# Patient Record
Sex: Female | Born: 1960
Health system: Southern US, Community
[De-identification: ages and names within clinical notes are randomized; demographics above are authoritative.]

## PROBLEM LIST (undated history)

## (undated) DIAGNOSIS — D249 Benign neoplasm of unspecified breast: Secondary | ICD-10-CM

## (undated) DIAGNOSIS — Z973 Presence of spectacles and contact lenses: Secondary | ICD-10-CM

## (undated) DIAGNOSIS — IMO0002 Reserved for concepts with insufficient information to code with codable children: Secondary | ICD-10-CM

## (undated) DIAGNOSIS — E079 Disorder of thyroid, unspecified: Secondary | ICD-10-CM

## (undated) DIAGNOSIS — G479 Sleep disorder, unspecified: Secondary | ICD-10-CM

## (undated) DIAGNOSIS — K8020 Calculus of gallbladder without cholecystitis without obstruction: Secondary | ICD-10-CM

## (undated) DIAGNOSIS — R51 Headache: Secondary | ICD-10-CM

## (undated) DIAGNOSIS — N6452 Nipple discharge: Secondary | ICD-10-CM

## (undated) DIAGNOSIS — M5020 Other cervical disc displacement, unspecified cervical region: Secondary | ICD-10-CM

## (undated) DIAGNOSIS — R519 Headache, unspecified: Secondary | ICD-10-CM

## (undated) HISTORY — DX: Headache: R51

## (undated) HISTORY — DX: Headache, unspecified: R51.9

## (undated) HISTORY — DX: Disorder of thyroid, unspecified: E07.9

## (undated) HISTORY — PX: APPENDECTOMY: SHX54

## (undated) HISTORY — DX: Other cervical disc displacement, unspecified cervical region: M50.20

## (undated) HISTORY — PX: ABDOMINAL HYSTERECTOMY: SHX81

## (undated) HISTORY — DX: Sleep disorder, unspecified: G47.9

## (undated) HISTORY — DX: Presence of spectacles and contact lenses: Z97.3

## (undated) HISTORY — DX: Reserved for concepts with insufficient information to code with codable children: IMO0002

## (undated) HISTORY — PX: CHOLECYSTECTOMY: SHX55

## (undated) HISTORY — DX: Calculus of gallbladder without cholecystitis without obstruction: K80.20

## (undated) HISTORY — DX: Nipple discharge: N64.52

## (undated) HISTORY — DX: Benign neoplasm of unspecified breast: D24.9

---

## 1997-09-02 ENCOUNTER — Other Ambulatory Visit: Admission: RE | Admit: 1997-09-02 | Discharge: 1997-09-02 | Payer: Self-pay | Admitting: *Deleted

## 1998-09-06 ENCOUNTER — Other Ambulatory Visit: Admission: RE | Admit: 1998-09-06 | Discharge: 1998-09-06 | Payer: Self-pay | Admitting: *Deleted

## 1998-09-30 ENCOUNTER — Ambulatory Visit (HOSPITAL_COMMUNITY): Admission: RE | Admit: 1998-09-30 | Discharge: 1998-09-30 | Payer: Self-pay | Admitting: *Deleted

## 1998-12-29 ENCOUNTER — Encounter (INDEPENDENT_AMBULATORY_CARE_PROVIDER_SITE_OTHER): Payer: Self-pay | Admitting: Specialist

## 1998-12-29 ENCOUNTER — Inpatient Hospital Stay (HOSPITAL_COMMUNITY): Admission: RE | Admit: 1998-12-29 | Discharge: 1999-01-01 | Payer: Self-pay | Admitting: *Deleted

## 1999-10-24 ENCOUNTER — Other Ambulatory Visit: Admission: RE | Admit: 1999-10-24 | Discharge: 1999-10-24 | Payer: Self-pay | Admitting: *Deleted

## 2001-02-21 ENCOUNTER — Other Ambulatory Visit: Admission: RE | Admit: 2001-02-21 | Discharge: 2001-02-21 | Payer: Self-pay | Admitting: *Deleted

## 2001-02-26 ENCOUNTER — Ambulatory Visit (HOSPITAL_COMMUNITY): Admission: RE | Admit: 2001-02-26 | Discharge: 2001-02-26 | Payer: Self-pay | Admitting: *Deleted

## 2001-02-26 ENCOUNTER — Encounter: Payer: Self-pay | Admitting: *Deleted

## 2002-02-23 ENCOUNTER — Other Ambulatory Visit: Admission: RE | Admit: 2002-02-23 | Discharge: 2002-02-23 | Payer: Self-pay | Admitting: *Deleted

## 2003-03-01 ENCOUNTER — Other Ambulatory Visit: Admission: RE | Admit: 2003-03-01 | Discharge: 2003-03-01 | Payer: Self-pay | Admitting: *Deleted

## 2004-03-10 ENCOUNTER — Other Ambulatory Visit: Admission: RE | Admit: 2004-03-10 | Discharge: 2004-03-10 | Payer: Self-pay | Admitting: Obstetrics and Gynecology

## 2005-04-16 ENCOUNTER — Other Ambulatory Visit: Admission: RE | Admit: 2005-04-16 | Discharge: 2005-04-16 | Payer: Self-pay | Admitting: Obstetrics and Gynecology

## 2006-11-08 ENCOUNTER — Encounter: Admission: RE | Admit: 2006-11-08 | Discharge: 2006-11-08 | Payer: Self-pay | Admitting: Obstetrics and Gynecology

## 2006-11-21 ENCOUNTER — Encounter: Admission: RE | Admit: 2006-11-21 | Discharge: 2006-11-21 | Payer: Self-pay | Admitting: Obstetrics and Gynecology

## 2006-11-21 ENCOUNTER — Encounter (INDEPENDENT_AMBULATORY_CARE_PROVIDER_SITE_OTHER): Payer: Self-pay | Admitting: Diagnostic Radiology

## 2007-11-05 ENCOUNTER — Encounter: Admission: RE | Admit: 2007-11-05 | Discharge: 2007-11-05 | Payer: Self-pay | Admitting: Obstetrics and Gynecology

## 2007-11-13 ENCOUNTER — Encounter: Admission: RE | Admit: 2007-11-13 | Discharge: 2007-11-13 | Payer: Self-pay | Admitting: Obstetrics and Gynecology

## 2008-04-28 ENCOUNTER — Encounter: Admission: RE | Admit: 2008-04-28 | Discharge: 2008-04-28 | Payer: Self-pay | Admitting: Internal Medicine

## 2008-12-02 ENCOUNTER — Encounter: Admission: RE | Admit: 2008-12-02 | Discharge: 2008-12-02 | Payer: Self-pay | Admitting: Internal Medicine

## 2009-02-08 ENCOUNTER — Encounter: Admission: RE | Admit: 2009-02-08 | Discharge: 2009-02-08 | Payer: Self-pay | Admitting: Gastroenterology

## 2009-02-16 ENCOUNTER — Encounter: Admission: RE | Admit: 2009-02-16 | Discharge: 2009-02-16 | Payer: Self-pay | Admitting: Gastroenterology

## 2009-08-05 ENCOUNTER — Encounter: Admission: RE | Admit: 2009-08-05 | Discharge: 2009-08-05 | Payer: Self-pay | Admitting: General Surgery

## 2009-12-06 ENCOUNTER — Encounter: Admission: RE | Admit: 2009-12-06 | Discharge: 2009-12-06 | Payer: Self-pay | Admitting: Obstetrics and Gynecology

## 2010-06-04 DIAGNOSIS — K8020 Calculus of gallbladder without cholecystitis without obstruction: Secondary | ICD-10-CM

## 2010-06-04 HISTORY — PX: BREAST EXCISIONAL BIOPSY: SUR124

## 2010-06-04 HISTORY — DX: Calculus of gallbladder without cholecystitis without obstruction: K80.20

## 2010-06-24 ENCOUNTER — Other Ambulatory Visit: Payer: Self-pay | Admitting: Gastroenterology

## 2010-06-24 DIAGNOSIS — K824 Cholesterolosis of gallbladder: Secondary | ICD-10-CM

## 2010-06-25 ENCOUNTER — Encounter: Payer: Self-pay | Admitting: Obstetrics and Gynecology

## 2010-08-03 ENCOUNTER — Other Ambulatory Visit: Payer: Self-pay

## 2010-08-07 ENCOUNTER — Ambulatory Visit
Admission: RE | Admit: 2010-08-07 | Discharge: 2010-08-07 | Disposition: A | Discharge status: Home / Self Care | Payer: BC Managed Care – PPO | Source: Ambulatory Visit | Attending: Gastroenterology | Admitting: Gastroenterology

## 2010-08-07 DIAGNOSIS — K824 Cholesterolosis of gallbladder: Secondary | ICD-10-CM

## 2010-09-04 ENCOUNTER — Encounter (HOSPITAL_COMMUNITY)
Admission: RE | Admit: 2010-09-04 | Discharge: 2010-09-04 | Disposition: A | Discharge status: Home / Self Care | Payer: BC Managed Care – PPO | Source: Ambulatory Visit | Attending: General Surgery | Admitting: General Surgery

## 2010-09-04 DIAGNOSIS — Z01812 Encounter for preprocedural laboratory examination: Secondary | ICD-10-CM | POA: Insufficient documentation

## 2010-09-04 LAB — DIFFERENTIAL
Basophils Absolute: 0 10*3/uL (ref 0.0–0.1)
Basophils Relative: 1 % (ref 0–1)
Eosinophils Relative: 2 % (ref 0–5)
Monocytes Absolute: 0.5 10*3/uL (ref 0.1–1.0)
Neutro Abs: 2.3 10*3/uL (ref 1.7–7.7)

## 2010-09-04 LAB — CBC
MCHC: 33.4 g/dL (ref 30.0–36.0)
RDW: 12.5 % (ref 11.5–15.5)
WBC: 5.4 10*3/uL (ref 4.0–10.5)

## 2010-09-04 LAB — COMPREHENSIVE METABOLIC PANEL
ALT: 18 U/L (ref 0–35)
AST: 22 U/L (ref 0–37)
Albumin: 4.2 g/dL (ref 3.5–5.2)
Calcium: 9.4 mg/dL (ref 8.4–10.5)
GFR calc Af Amer: >60 mL/min (ref >60)
Potassium: 4 mEq/L (ref 3.5–5.1)
Sodium: 139 mEq/L (ref 135–145)
Total Protein: 7.3 g/dL (ref 6.0–8.3)

## 2010-09-12 ENCOUNTER — Other Ambulatory Visit: Payer: Self-pay | Admitting: General Surgery

## 2010-09-12 ENCOUNTER — Ambulatory Visit (HOSPITAL_COMMUNITY): Payer: BC Managed Care – PPO

## 2010-09-12 ENCOUNTER — Ambulatory Visit (HOSPITAL_COMMUNITY)
Admission: RE | Admit: 2010-09-12 | Discharge: 2010-09-12 | Disposition: A | Discharge status: Home / Self Care | Payer: BC Managed Care – PPO | Source: Ambulatory Visit | Attending: General Surgery | Admitting: General Surgery

## 2010-09-12 DIAGNOSIS — K824 Cholesterolosis of gallbladder: Secondary | ICD-10-CM | POA: Insufficient documentation

## 2010-09-12 DIAGNOSIS — Z01812 Encounter for preprocedural laboratory examination: Secondary | ICD-10-CM | POA: Insufficient documentation

## 2010-09-12 DIAGNOSIS — K8010 Calculus of gallbladder with chronic cholecystitis without obstruction: Secondary | ICD-10-CM | POA: Insufficient documentation

## 2010-09-13 NOTE — Op Note (Signed)
NAMELAYSA, KIMMEY                ACCOUNT NO.:  [id number]  MEDICAL RECORD NO.:  [id number]           PATIENT TYPE:  O  LOCATION:  [facility]SC                         FACILITY:  [facility]  PHYSICIAN:  Adolph Pollack, M.D.DATE OF BIRTH:  Feb 15, 1961  DATE OF PROCEDURE:  09/12/2010 DATE OF DISCHARGE:  09/12/2010                              OPERATIVE REPORT   POSTOPERATIVE DIAGNOSIS:  Gallbladder polyps.  POSTOPERATIVE DIAGNOSIS:  Gallbladder polyps.  PROCEDURE:  Laparoscopic cholecystectomy with cholangiogram.  SURGEON:  Adolph Pollack, MD  ASSISTANT:  Anselm Pancoast. Zachery Dakins, MD  ANESTHESIA:  General.  INDICATIONS:  Ms. Iversen is a 50 year old female who has been discovered to have gallbladder polyps.  A followup ultrasound demonstrated enlargement of the polyps, largest one was almost 1 cm.  Because of the potential association with gallbladder cancer and polyps 1 cm greater, I have recommended a laparoscopic cholecystectomy and she agrees. Procedure risks were discussed preoperatively.  TECHNIQUE:  She was brought to the operating room, placed supine on the operating room table.  General anesthetic was administered.  The abdominal wall was sterilely prepped and draped.  Marcaine solution was infiltrated in the subumbilical region and the subcutaneous tissue.  A small subumbilical incision was made through the skin and subcutaneous tissue, fascia and peritoneum entering the peritoneal cavity under direct vision.  A purse-string suture of 0 Vicryl was placed on the fascial edges.  A Hasson trocar was placed into the peritoneal cavity, and a pneumoperitoneum created by insufflation of CO2 gas.  Next a laparoscope was introduced.  There was no underlying bleeding or organ injury.  I noted some thin adhesions between the cecum and right colon and lateral sidewall.  The gallbladder was without any acute inflammatory changes.  She was placed in a reverse Trendelenburg position,  the right side tilted slightly up.  A 5 mm incision was made.  A 5-mm trocar was placed in the epigastric incision and two 5-mm trocar was placed in the right upper quadrant.  The fundus of the gallbladder was grasped and thin adhesions between the omentum and gallbladder taken down bluntly.  Using blunt dissection, I mobilized the infundibulum, identified the cystic duct and anterior branch of the cystic artery.  I created a window around the cystic duct and the anterior branch of the cystic artery.  I clipped the anterior branch of the cystic artery and divided it.  A critical view was achieved.  Clip was placed at the cystic duct gallbladder junction.  A small incision was made in the cystic duct.  A cholangiocath was passed through the anterior abdominal wall, placed into the cystic duct and a cholangiogram was performed.  Under real time fluoroscopy, dilute contrast was injected to the cystic duct.  Common hepatic, right and left hepatic, and common bile ducts all filled promptly and contrast drained into the duodenum without obvious evidence of obstruction.  Final reports pending the radiologist's interpretation.  The cholangiocath was removed, cystic duct was clipped 2 times on the biliary side and divided.  Posterior branch of the cystic artery was identified close to the gallbladder, was clipped and divided.  The gallbladder was then dissected free from liver intact using electrocautery and placed into a pouch bag.  The gallbladder fossa was copiously irrigated and inspected.  There was no evidence of bleeding or bile leak.  Following this the gallbladder was removed through the subumbilical incision and the subumbilical fascial defect closed by tightening up and tying down the purse-string suture.  CO2 gas was released and the remaining trocars were removed.  The skin incisions were closed with 4-0 Monocryl subcuticular stitches. Steri-Strips and sterile dressings were  applied.  She tolerated the procedure well without any apparent complications.  She was taken to the recovery room in satisfactory condition.     Adolph Pollack, M.D.     Kari Baars  D:  09/12/2010  T:  09/13/2010  Job:  [id number]  cc:   Anselmo Rod, MD, Josetta Huddle, M.D.  Electronically Signed by Avel Peace M.D. on 09/13/2010 04:59:29 PM

## 2010-12-11 ENCOUNTER — Other Ambulatory Visit: Payer: Self-pay | Admitting: Obstetrics and Gynecology

## 2010-12-11 DIAGNOSIS — Z1231 Encounter for screening mammogram for malignant neoplasm of breast: Secondary | ICD-10-CM

## 2010-12-18 ENCOUNTER — Ambulatory Visit: Payer: BC Managed Care – PPO

## 2010-12-18 ENCOUNTER — Ambulatory Visit
Admission: RE | Admit: 2010-12-18 | Discharge: 2010-12-18 | Disposition: A | Discharge status: Home / Self Care | Payer: BC Managed Care – PPO | Source: Ambulatory Visit | Attending: Obstetrics and Gynecology | Admitting: Obstetrics and Gynecology

## 2010-12-18 DIAGNOSIS — Z1231 Encounter for screening mammogram for malignant neoplasm of breast: Secondary | ICD-10-CM

## 2011-01-01 ENCOUNTER — Other Ambulatory Visit: Payer: Self-pay | Admitting: Obstetrics and Gynecology

## 2011-01-01 DIAGNOSIS — N6452 Nipple discharge: Secondary | ICD-10-CM

## 2011-01-05 ENCOUNTER — Inpatient Hospital Stay: Admission: RE | Admit: 2011-01-05 | Payer: BC Managed Care – PPO | Source: Ambulatory Visit

## 2011-01-22 ENCOUNTER — Ambulatory Visit
Admission: RE | Admit: 2011-01-22 | Discharge: 2011-01-22 | Disposition: A | Discharge status: Home / Self Care | Payer: BC Managed Care – PPO | Source: Ambulatory Visit | Attending: Obstetrics and Gynecology | Admitting: Obstetrics and Gynecology

## 2011-01-22 DIAGNOSIS — N6452 Nipple discharge: Secondary | ICD-10-CM

## 2011-01-23 ENCOUNTER — Ambulatory Visit (INDEPENDENT_AMBULATORY_CARE_PROVIDER_SITE_OTHER): Payer: BC Managed Care – PPO | Admitting: General Surgery

## 2011-01-23 ENCOUNTER — Encounter (INDEPENDENT_AMBULATORY_CARE_PROVIDER_SITE_OTHER): Payer: Self-pay | Admitting: General Surgery

## 2011-01-23 VITALS — BP 108/76 | HR 62 | Temp 97.4°F | Ht 64.0 in | Wt 127.1 lb

## 2011-01-23 DIAGNOSIS — N6459 Other signs and symptoms in breast: Secondary | ICD-10-CM

## 2011-01-23 DIAGNOSIS — N6452 Nipple discharge: Secondary | ICD-10-CM

## 2011-01-23 NOTE — Progress Notes (Signed)
Chief Complaint  Patient presents with  . Other    ne wpt - eval of abnormal nipple discharge on right breast    HPI Kara Sweeney is a 50 y.o. female.   HPIShe is referred by Dr. Deboraha Sprang because of bloody right nipple discharge.  She had a mammogram and one week after that noted blood in her bra coming from her right nipple.  Other than mammogram, no trauma to the area. No palpable masses. Had 2 episodes of brief pain. No family history of breast cancer. Menarche was early age 43. First pregnancy in her 57s. She breast-fed her children. Had a hysterectomy at approximately age 69. Has been on hormone replacement therapy ever since.  No lesions noted on mammogram. Ductogram demonstrated an area of irregularity 11 mm below the nipple.  Past Medical History  Diagnosis Date  . Thyroid disease     hypothyroidism   . Gallstone 2012  . Cyst     on right hip and bottom of right foot   . Fibroadenoma     right breast   . Nipple discharge   . Wears glasses     contacts  . Herniated disc, cervical     Past Surgical History  Procedure Date  . Abdominal hysterectomy   . Cholecystectomy     Family History  Problem Relation Age of Onset  . Cancer Father     bladder  . Cancer Sister     liver     Social History History  Substance Use Topics  . Smoking status: Never Smoker   . Smokeless tobacco: Not on file  . Alcohol Use: No    Allergies  Allergen Reactions  . Sulfa Drugs Cross Reactors     Makes pt itchy and get hives     Current Outpatient Prescriptions  Medication Sig Dispense Refill  . Cholecalciferol (VITAMIN D PO) Take 1,000 Units by mouth.        . ESTRADIOL PO Take 2 mg by mouth daily.        . Levothyroxine Sodium (L-THYROXINE SODIUM) POWD 125 mcg by Does not apply route.        . Zolpidem Tartrate (AMBIEN PO) Take by mouth as needed.          Review of Systems Review of Systems  Constitutional: Negative.   HENT:       Wears glasses.  Respiratory: Negative.     Cardiovascular: Negative.   Gastrointestinal: Negative.   Skin: Negative.     Blood pressure 108/76, pulse 62, temperature 97.4 F (36.3 C), height 5\' 4"  (1.626 m), weight 127 lb 2 oz (57.664 kg).  Physical Exam Physical Exam  Constitutional: She appears well-developed and well-nourished. No distress.  Neck: Normal range of motion. Neck supple. No JVD present.  Respiratory:       Right breast-no dominant masses; small amount of bloody nipple discharge at 9:00 position; no suspicious rashes.  Left breast-no dominant masses, nipple discharge, suspicious skin rashes.  Nodes-no palpable cervical, supraclavicular, axillary adenopathy.  GI: Soft.       Small upper abdominal scars.  Musculoskeletal:       1 cm soft tissue mass right plantar surface.  Lymphadenopathy:    She has no cervical adenopathy.  Skin: Skin is warm.     Data Reviewed Mammogram and ductogram.  Assessment    Bloody right nipple discharge-most likely etiology is a benign papilloma; breast cancer is also in the differential however.    Plan  Right breast duct excision and biopsy. The procedure and risks were explained to her. Risks include but are not limited to bleeding, infection, wound problems, anesthesia, possibility of second surgery. She seemed to understand this and agreed with the plan.       Hanne Kegg J 01/23/2011, 2:38 PM

## 2011-02-02 ENCOUNTER — Other Ambulatory Visit (HOSPITAL_COMMUNITY)
Admission: RE | Admit: 2011-02-02 | Discharge: 2011-02-02 | Disposition: A | Discharge status: Home / Self Care | Payer: BC Managed Care – PPO | Source: Ambulatory Visit | Attending: General Surgery | Admitting: General Surgery

## 2011-02-02 ENCOUNTER — Other Ambulatory Visit (INDEPENDENT_AMBULATORY_CARE_PROVIDER_SITE_OTHER): Payer: Self-pay | Admitting: General Surgery

## 2011-02-02 DIAGNOSIS — N6459 Other signs and symptoms in breast: Secondary | ICD-10-CM | POA: Insufficient documentation

## 2011-02-02 DIAGNOSIS — D249 Benign neoplasm of unspecified breast: Secondary | ICD-10-CM

## 2011-02-09 ENCOUNTER — Telehealth (INDEPENDENT_AMBULATORY_CARE_PROVIDER_SITE_OTHER): Payer: Self-pay

## 2011-02-09 NOTE — Telephone Encounter (Signed)
Path reviewed with patient- She is aware that Dr. Abbey Chatters will go over the report in detail on post op visit. RMP

## 2011-02-19 ENCOUNTER — Ambulatory Visit (INDEPENDENT_AMBULATORY_CARE_PROVIDER_SITE_OTHER): Payer: BC Managed Care – PPO | Admitting: General Surgery

## 2011-02-23 ENCOUNTER — Other Ambulatory Visit: Payer: Self-pay | Admitting: Gastroenterology

## 2011-02-23 DIAGNOSIS — R7989 Other specified abnormal findings of blood chemistry: Secondary | ICD-10-CM

## 2011-02-26 ENCOUNTER — Ambulatory Visit (INDEPENDENT_AMBULATORY_CARE_PROVIDER_SITE_OTHER): Payer: BC Managed Care – PPO | Admitting: General Surgery

## 2011-02-26 ENCOUNTER — Other Ambulatory Visit: Payer: BC Managed Care – PPO

## 2011-02-26 VITALS — BP 102/62 | HR 84 | Resp 22

## 2011-02-26 DIAGNOSIS — D249 Benign neoplasm of unspecified breast: Secondary | ICD-10-CM

## 2011-02-26 NOTE — Progress Notes (Signed)
Operation: Right breast duct excision and biopsy  Date: February 02, 2011  Pathology: Intraductal papilloma, benign fibrocystic change, PASH  HPI: She is here for her first postoperative visit.  She is doing well and has no breast complaints.   Physical Exam:Gen.-she looks well and is in no acute distress.  Right breast-the circumareolar incision is clean and intact with Steri-Strips present.   Assessment:  Bloody nipple discharge secondary to intraductal papilloma. Wound is healing well.  Plan: I explained to her she needs an annual breast exam and annual mammogram. Return visit p.r.n.

## 2011-03-01 ENCOUNTER — Ambulatory Visit
Admission: RE | Admit: 2011-03-01 | Discharge: 2011-03-01 | Disposition: A | Discharge status: Home / Self Care | Payer: BC Managed Care – PPO | Source: Ambulatory Visit | Attending: Gastroenterology | Admitting: Gastroenterology

## 2011-03-01 DIAGNOSIS — R7989 Other specified abnormal findings of blood chemistry: Secondary | ICD-10-CM

## 2011-03-01 MED ORDER — GADOBENATE DIMEGLUMINE 529 MG/ML IV SOLN
12.0000 mL | Freq: Once | INTRAVENOUS | Status: AC | PRN
  Administered 2011-03-01: 12 mL via INTRAVENOUS

## 2011-03-15 ENCOUNTER — Encounter: Payer: Self-pay | Admitting: *Deleted

## 2011-03-15 ENCOUNTER — Encounter: Payer: BC Managed Care – PPO | Attending: Endocrinology | Admitting: *Deleted

## 2011-03-15 DIAGNOSIS — Z713 Dietary counseling and surveillance: Secondary | ICD-10-CM | POA: Insufficient documentation

## 2011-03-15 DIAGNOSIS — E119 Type 2 diabetes mellitus without complications: Secondary | ICD-10-CM | POA: Insufficient documentation

## 2011-03-15 NOTE — Patient Instructions (Addendum)
Goals:  Follow Diabetes Meal Plan as instructed (yellow card).  Eat 3 meals and 1-3 snacks daily; Avoid meal skipping and have snack before workouts.  Add lean protein foods to all meals/snacks.  Continue physical activity regimen of 5 days/week for 60 mins.  Limit sweets and sugar sweetened beverages.

## 2011-03-15 NOTE — Progress Notes (Signed)
Medical Nutrition Therapy:  Appt start time: 1100 end time:  1200.  Assessment:  Primary concerns today: T2DM, New onset.  Here for nutritional counseling for T2DM. Reports her A1c is 6.3% and was very knowledgeable of DM.  Lost sister to liver cancer and now her lipases are elevated. In last month, pt has started exercising 5 days/week and watching breads, etc. Overall dietary intake adequate, though excessive CHO intake noted at hs snack (20 oz skim milk w/ a few cookies). Not instructed to check BGs; next A1c scheduled for January '13.   MEDICATIONS: See medication list; reviewed with pt.   DIETARY INTAKE:  Usual eating pattern includes 2 meals and 0-1 snacks per day.  Recent dinner of baked fish, asian salad w/ ginger dressing, black eyed peas, broccoli, and water; snack of 20 oz milk + 1-2 cookies.  Usual physical activity: For last month - 5 d/wk: walk for 1 hr OR bike 45 min  Estimated energy needs: 1400 calories 155-160 g carbohydrates  Progress Towards Goal(s):  NEW   Nutritional Diagnosis:  [place]-2.2 Altered nutrition-related laboratory (HgA1c) related to new onset T2DM as evidenced by patient reported A1c of 6.3% and excessive CHO at snacks.    Intervention/Goals:  Follow Diabetes Meal Plan as instructed (yellow card).  Eat 3 meals and 1-3 snacks daily; Avoid meal skipping and have snack before workouts.  Add lean protein foods to all meals/snacks.  Continue physical activity regimen of 5 days/week for 60 mins.  Limit sweets and sugar sweetened beverages.  Monitoring/Evaluation:  Dietary intake, exercise, A1c (as available), and body weight prn.

## 2012-01-30 ENCOUNTER — Other Ambulatory Visit: Payer: Self-pay | Admitting: Internal Medicine

## 2012-01-30 DIAGNOSIS — Z1231 Encounter for screening mammogram for malignant neoplasm of breast: Secondary | ICD-10-CM

## 2012-02-14 ENCOUNTER — Ambulatory Visit
Admission: RE | Admit: 2012-02-14 | Discharge: 2012-02-14 | Disposition: A | Discharge status: Home / Self Care | Payer: BC Managed Care – PPO | Source: Ambulatory Visit | Attending: Internal Medicine | Admitting: Internal Medicine

## 2012-02-14 DIAGNOSIS — Z1231 Encounter for screening mammogram for malignant neoplasm of breast: Secondary | ICD-10-CM

## 2013-06-18 ENCOUNTER — Other Ambulatory Visit: Payer: Self-pay

## 2013-06-18 DIAGNOSIS — Z1231 Encounter for screening mammogram for malignant neoplasm of breast: Secondary | ICD-10-CM

## 2013-07-09 ENCOUNTER — Ambulatory Visit
Admission: RE | Admit: 2013-07-09 | Discharge: 2013-07-09 | Disposition: A | Discharge status: Home / Self Care | Payer: BC Managed Care – PPO | Source: Ambulatory Visit

## 2013-07-09 DIAGNOSIS — Z1231 Encounter for screening mammogram for malignant neoplasm of breast: Secondary | ICD-10-CM

## 2013-07-15 ENCOUNTER — Other Ambulatory Visit: Payer: Self-pay | Admitting: Obstetrics and Gynecology

## 2013-07-15 DIAGNOSIS — R928 Other abnormal and inconclusive findings on diagnostic imaging of breast: Secondary | ICD-10-CM

## 2013-07-27 ENCOUNTER — Ambulatory Visit
Admission: RE | Admit: 2013-07-27 | Discharge: 2013-07-27 | Disposition: A | Discharge status: Home / Self Care | Payer: Self-pay | Source: Ambulatory Visit | Attending: Obstetrics and Gynecology | Admitting: Obstetrics and Gynecology

## 2013-07-27 DIAGNOSIS — R928 Other abnormal and inconclusive findings on diagnostic imaging of breast: Secondary | ICD-10-CM

## 2014-04-22 ENCOUNTER — Other Ambulatory Visit: Payer: Self-pay | Admitting: Obstetrics and Gynecology

## 2014-04-26 LAB — CYTOLOGY - PAP

## 2014-09-24 ENCOUNTER — Other Ambulatory Visit: Payer: Self-pay

## 2014-09-24 DIAGNOSIS — Z1231 Encounter for screening mammogram for malignant neoplasm of breast: Secondary | ICD-10-CM

## 2014-10-04 ENCOUNTER — Encounter (INDEPENDENT_AMBULATORY_CARE_PROVIDER_SITE_OTHER): Payer: Self-pay

## 2014-10-04 ENCOUNTER — Ambulatory Visit
Admission: RE | Admit: 2014-10-04 | Discharge: 2014-10-04 | Disposition: A | Discharge status: Home / Self Care | Payer: BLUE CROSS/BLUE SHIELD | Source: Ambulatory Visit

## 2014-10-04 DIAGNOSIS — Z1231 Encounter for screening mammogram for malignant neoplasm of breast: Secondary | ICD-10-CM

## 2014-10-06 ENCOUNTER — Other Ambulatory Visit: Payer: Self-pay | Admitting: Obstetrics and Gynecology

## 2014-10-06 DIAGNOSIS — R928 Other abnormal and inconclusive findings on diagnostic imaging of breast: Secondary | ICD-10-CM

## 2014-10-08 ENCOUNTER — Ambulatory Visit
Admission: RE | Admit: 2014-10-08 | Discharge: 2014-10-08 | Disposition: A | Discharge status: Home / Self Care | Payer: BLUE CROSS/BLUE SHIELD | Source: Ambulatory Visit | Attending: Obstetrics and Gynecology | Admitting: Obstetrics and Gynecology

## 2014-10-08 ENCOUNTER — Other Ambulatory Visit: Payer: Self-pay | Admitting: Obstetrics and Gynecology

## 2014-10-08 DIAGNOSIS — R928 Other abnormal and inconclusive findings on diagnostic imaging of breast: Secondary | ICD-10-CM

## 2015-12-23 DIAGNOSIS — H1045 Other chronic allergic conjunctivitis: Secondary | ICD-10-CM | POA: Diagnosis not present

## 2016-02-13 ENCOUNTER — Other Ambulatory Visit: Payer: Self-pay | Admitting: Obstetrics and Gynecology

## 2016-02-13 DIAGNOSIS — Z1231 Encounter for screening mammogram for malignant neoplasm of breast: Secondary | ICD-10-CM

## 2016-02-20 DIAGNOSIS — E139 Other specified diabetes mellitus without complications: Secondary | ICD-10-CM | POA: Diagnosis not present

## 2016-02-20 DIAGNOSIS — E7800 Pure hypercholesterolemia, unspecified: Secondary | ICD-10-CM | POA: Diagnosis not present

## 2016-02-20 DIAGNOSIS — E039 Hypothyroidism, unspecified: Secondary | ICD-10-CM | POA: Diagnosis not present

## 2016-02-23 ENCOUNTER — Ambulatory Visit
Admission: RE | Admit: 2016-02-23 | Discharge: 2016-02-23 | Disposition: A | Discharge status: Home / Self Care | Payer: BLUE CROSS/BLUE SHIELD | Source: Ambulatory Visit | Attending: Obstetrics and Gynecology | Admitting: Obstetrics and Gynecology

## 2016-02-23 DIAGNOSIS — Z1231 Encounter for screening mammogram for malignant neoplasm of breast: Secondary | ICD-10-CM | POA: Diagnosis not present

## 2016-02-27 ENCOUNTER — Other Ambulatory Visit: Payer: Self-pay | Admitting: Obstetrics and Gynecology

## 2016-02-27 DIAGNOSIS — R928 Other abnormal and inconclusive findings on diagnostic imaging of breast: Secondary | ICD-10-CM

## 2016-03-02 ENCOUNTER — Ambulatory Visit
Admission: RE | Admit: 2016-03-02 | Discharge: 2016-03-02 | Disposition: A | Discharge status: Home / Self Care | Payer: BLUE CROSS/BLUE SHIELD | Source: Ambulatory Visit | Attending: Obstetrics and Gynecology | Admitting: Obstetrics and Gynecology

## 2016-03-02 ENCOUNTER — Other Ambulatory Visit: Payer: Self-pay | Admitting: Obstetrics and Gynecology

## 2016-03-02 DIAGNOSIS — N6310 Unspecified lump in the right breast, unspecified quadrant: Secondary | ICD-10-CM

## 2016-03-02 DIAGNOSIS — R921 Mammographic calcification found on diagnostic imaging of breast: Secondary | ICD-10-CM | POA: Diagnosis not present

## 2016-03-02 DIAGNOSIS — R928 Other abnormal and inconclusive findings on diagnostic imaging of breast: Secondary | ICD-10-CM

## 2016-03-07 ENCOUNTER — Ambulatory Visit
Admission: RE | Admit: 2016-03-07 | Discharge: 2016-03-07 | Disposition: A | Discharge status: Home / Self Care | Payer: BLUE CROSS/BLUE SHIELD | Source: Ambulatory Visit | Attending: Obstetrics and Gynecology | Admitting: Obstetrics and Gynecology

## 2016-03-07 DIAGNOSIS — R749 Abnormal serum enzyme level, unspecified: Secondary | ICD-10-CM | POA: Diagnosis not present

## 2016-03-07 DIAGNOSIS — R7301 Impaired fasting glucose: Secondary | ICD-10-CM | POA: Diagnosis not present

## 2016-03-07 DIAGNOSIS — E7800 Pure hypercholesterolemia, unspecified: Secondary | ICD-10-CM | POA: Diagnosis not present

## 2016-03-07 DIAGNOSIS — N6310 Unspecified lump in the right breast, unspecified quadrant: Secondary | ICD-10-CM

## 2016-03-07 DIAGNOSIS — R920 Mammographic microcalcification found on diagnostic imaging of breast: Secondary | ICD-10-CM | POA: Diagnosis not present

## 2016-03-07 DIAGNOSIS — N6011 Diffuse cystic mastopathy of right breast: Secondary | ICD-10-CM | POA: Diagnosis not present

## 2016-03-07 DIAGNOSIS — Z23 Encounter for immunization: Secondary | ICD-10-CM | POA: Diagnosis not present

## 2016-03-07 DIAGNOSIS — E039 Hypothyroidism, unspecified: Secondary | ICD-10-CM | POA: Diagnosis not present

## 2016-05-08 DIAGNOSIS — E039 Hypothyroidism, unspecified: Secondary | ICD-10-CM | POA: Diagnosis not present

## 2016-09-19 DIAGNOSIS — Z13820 Encounter for screening for osteoporosis: Secondary | ICD-10-CM | POA: Diagnosis not present

## 2016-09-19 DIAGNOSIS — Z6822 Body mass index (BMI) 22.0-22.9, adult: Secondary | ICD-10-CM | POA: Diagnosis not present

## 2016-09-19 DIAGNOSIS — Z01419 Encounter for gynecological examination (general) (routine) without abnormal findings: Secondary | ICD-10-CM | POA: Diagnosis not present

## 2016-10-03 DIAGNOSIS — Z23 Encounter for immunization: Secondary | ICD-10-CM | POA: Diagnosis not present

## 2017-01-31 ENCOUNTER — Other Ambulatory Visit: Payer: Self-pay | Admitting: Obstetrics and Gynecology

## 2017-01-31 DIAGNOSIS — Z1231 Encounter for screening mammogram for malignant neoplasm of breast: Secondary | ICD-10-CM

## 2017-03-01 ENCOUNTER — Encounter: Payer: Self-pay | Admitting: Radiology

## 2017-03-01 ENCOUNTER — Ambulatory Visit
Admission: RE | Admit: 2017-03-01 | Discharge: 2017-03-01 | Disposition: A | Discharge status: Home / Self Care | Payer: BLUE CROSS/BLUE SHIELD | Source: Ambulatory Visit | Attending: Obstetrics and Gynecology | Admitting: Obstetrics and Gynecology

## 2017-03-01 DIAGNOSIS — Z1231 Encounter for screening mammogram for malignant neoplasm of breast: Secondary | ICD-10-CM | POA: Diagnosis not present

## 2017-03-08 DIAGNOSIS — R749 Abnormal serum enzyme level, unspecified: Secondary | ICD-10-CM | POA: Diagnosis not present

## 2017-03-08 DIAGNOSIS — E039 Hypothyroidism, unspecified: Secondary | ICD-10-CM | POA: Diagnosis not present

## 2017-03-08 DIAGNOSIS — E7800 Pure hypercholesterolemia, unspecified: Secondary | ICD-10-CM | POA: Diagnosis not present

## 2017-03-08 DIAGNOSIS — R7301 Impaired fasting glucose: Secondary | ICD-10-CM | POA: Diagnosis not present

## 2017-03-14 DIAGNOSIS — Z23 Encounter for immunization: Secondary | ICD-10-CM | POA: Diagnosis not present

## 2017-03-14 DIAGNOSIS — E7800 Pure hypercholesterolemia, unspecified: Secondary | ICD-10-CM | POA: Diagnosis not present

## 2017-03-14 DIAGNOSIS — E039 Hypothyroidism, unspecified: Secondary | ICD-10-CM | POA: Diagnosis not present

## 2017-03-14 DIAGNOSIS — R749 Abnormal serum enzyme level, unspecified: Secondary | ICD-10-CM | POA: Diagnosis not present

## 2017-03-14 DIAGNOSIS — R7301 Impaired fasting glucose: Secondary | ICD-10-CM | POA: Diagnosis not present

## 2017-06-17 DIAGNOSIS — E039 Hypothyroidism, unspecified: Secondary | ICD-10-CM | POA: Diagnosis not present

## 2017-07-10 DIAGNOSIS — H11159 Pinguecula, unspecified eye: Secondary | ICD-10-CM | POA: Diagnosis not present

## 2017-09-24 DIAGNOSIS — Z01419 Encounter for gynecological examination (general) (routine) without abnormal findings: Secondary | ICD-10-CM | POA: Diagnosis not present

## 2017-09-24 DIAGNOSIS — Z6823 Body mass index (BMI) 23.0-23.9, adult: Secondary | ICD-10-CM | POA: Diagnosis not present

## 2018-02-06 DIAGNOSIS — Z1283 Encounter for screening for malignant neoplasm of skin: Secondary | ICD-10-CM | POA: Diagnosis not present

## 2018-02-06 DIAGNOSIS — L720 Epidermal cyst: Secondary | ICD-10-CM | POA: Diagnosis not present

## 2018-02-06 DIAGNOSIS — R229 Localized swelling, mass and lump, unspecified: Secondary | ICD-10-CM | POA: Diagnosis not present

## 2018-02-14 DIAGNOSIS — L723 Sebaceous cyst: Secondary | ICD-10-CM | POA: Diagnosis not present

## 2018-03-06 DIAGNOSIS — R2231 Localized swelling, mass and lump, right upper limb: Secondary | ICD-10-CM | POA: Diagnosis not present

## 2018-03-13 DIAGNOSIS — E7800 Pure hypercholesterolemia, unspecified: Secondary | ICD-10-CM | POA: Diagnosis not present

## 2018-03-13 DIAGNOSIS — R7301 Impaired fasting glucose: Secondary | ICD-10-CM | POA: Diagnosis not present

## 2018-03-13 DIAGNOSIS — E039 Hypothyroidism, unspecified: Secondary | ICD-10-CM | POA: Diagnosis not present

## 2018-03-17 DIAGNOSIS — R2231 Localized swelling, mass and lump, right upper limb: Secondary | ICD-10-CM | POA: Diagnosis not present

## 2018-03-17 DIAGNOSIS — M67441 Ganglion, right hand: Secondary | ICD-10-CM | POA: Diagnosis not present

## 2018-03-20 DIAGNOSIS — E039 Hypothyroidism, unspecified: Secondary | ICD-10-CM | POA: Diagnosis not present

## 2018-03-20 DIAGNOSIS — R7301 Impaired fasting glucose: Secondary | ICD-10-CM | POA: Diagnosis not present

## 2018-03-20 DIAGNOSIS — E7800 Pure hypercholesterolemia, unspecified: Secondary | ICD-10-CM | POA: Diagnosis not present

## 2018-03-20 DIAGNOSIS — Z23 Encounter for immunization: Secondary | ICD-10-CM | POA: Diagnosis not present

## 2018-03-20 DIAGNOSIS — R749 Abnormal serum enzyme level, unspecified: Secondary | ICD-10-CM | POA: Diagnosis not present

## 2018-04-01 ENCOUNTER — Encounter: Payer: Self-pay | Admitting: Neurology

## 2018-04-01 ENCOUNTER — Ambulatory Visit (INDEPENDENT_AMBULATORY_CARE_PROVIDER_SITE_OTHER): Payer: BLUE CROSS/BLUE SHIELD | Admitting: Neurology

## 2018-04-01 VITALS — BP 123/79 | HR 72 | Ht 64.0 in | Wt 138.8 lb

## 2018-04-01 DIAGNOSIS — G4730 Sleep apnea, unspecified: Secondary | ICD-10-CM | POA: Diagnosis not present

## 2018-04-01 DIAGNOSIS — H5319 Other subjective visual disturbances: Secondary | ICD-10-CM | POA: Diagnosis not present

## 2018-04-01 NOTE — Progress Notes (Signed)
PATIENT: Kara Sweeney DOB: 1961-01-06  Chief Complaint  Patient presents with  . Possible TIA    Reports two episodes of visual disturbance (like looking through a kaleidoscope).  With the second event, she also noticed difficulty pronouncing her words.  Symptoms lasted around five minutes before resolving.   . Insomnia    She has difficulty sleeping.  She has tried Ambien in the past.  She felt oversedated with the medication.  She will occasionally take Melantonin 3mg .  Marland Kitchen PCP    Janie Morning, DO  . Endocrinology    Jacelyn Pi, MD - referring provider     HISTORICAL  Kara Sweeney is a 57 year old female seen in request by his endocrinologist Dr. Chalmers Cater, Mindi Curling, and the primary care physician Dr. Janie Morning, for evaluation of possible TIA, chronic insomnia, initial evaluation was April 01, 2018.  I have reviewed and summarized the referral note, she has hypothyroidism, on supplements  She reported 2 episode of sudden visual distortion that was a year apart, initial episode was in 2018, most recent episode was in October 2019, she missed her breakfast, was rushing cooking, then suddenly noticed kaleidoscope visual distortion in her right visual field, lasting for few minutes, also noticed when she was reading the description from the meat package, she has mild difficulty.  She denies significant headaches,  Since 2018, she was noted by her husband had frequent snoring in choking spells during her sleep, gasping for air, her husband has wearing earplugs, she has no difficulty falling to sleep, tends to woke up early morning, difficulty going back to sleep, denies excessive daytime sleepiness, she often woke up early morning and has mild frontal headaches, lasting for few hours,  Laboratory evaluations in October 2019, normal CMP, A1c of 5.6, LDL of 122, cholesterol of 193, TSH was mildly elevated at 5.65  REVIEW OF SYSTEMS: Full 14 system review of systems performed and  notable only for as above. All other review of systems were negative.  ALLERGIES: Allergies  Allergen Reactions  . Sulfa Drugs Cross Reactors     Makes pt itchy and get hives     HOME MEDICATIONS: Current Outpatient Medications  Medication Sig Dispense Refill  . estradiol (VIVELLE-DOT) 0.1 MG/24HR Place 1 patch onto the skin 2 (two) times a week.      . levothyroxine (SYNTHROID, LEVOTHROID) 175 MCG tablet Take 175 mcg by mouth daily before breakfast.    . MELATONIN PO Take 3 mg by mouth at bedtime as needed.     No current facility-administered medications for this visit.     PAST MEDICAL HISTORY: Past Medical History:  Diagnosis Date  . Cyst    on right hip and bottom of right foot   . Fibroadenoma    right breast   . Gallstone 2012  . Herniated disc, cervical   . Nipple discharge   . Sleeping difficulty   . Thyroid disease    hypothyroidism   . Wears glasses    contacts    PAST SURGICAL HISTORY: Past Surgical History:  Procedure Laterality Date  . ABDOMINAL HYSTERECTOMY    . BREAST EXCISIONAL BIOPSY Right 2012  . CHOLECYSTECTOMY      FAMILY HISTORY: Family History  Problem Relation Age of Onset  . Cancer Father        bladder  . Cancer Sister        liver; died at 76 yrs old  . Early death Sister 69  . Dementia Mother  SOCIAL HISTORY: Social History   Socioeconomic History  . Marital status: Married    Spouse name: Not on file  . Number of children: 3  . Years of education: college  . Highest education level: Master's degree (e.g., MA, MS, MEng, MEd, MSW, MBA)  Occupational History  . Occupation: Chief Technology Officer  Social Needs  . Financial resource strain: Not on file  . Food insecurity:    Worry: Not on file    Inability: Not on file  . Transportation needs:    Medical: Not on file    Non-medical: Not on file  Tobacco Use  . Smoking status: Never Smoker  . Smokeless tobacco: Never Used  Substance and Sexual Activity  . Alcohol use: No   . Drug use: No  . Sexual activity: Not on file  Lifestyle  . Physical activity:    Days per week: Not on file    Minutes per session: Not on file  . Stress: Not on file  Relationships  . Social connections:    Talks on phone: Not on file    Gets together: Not on file    Attends religious service: Not on file    Active member of club or organization: Not on file    Attends meetings of clubs or organizations: Not on file    Relationship status: Not on file  . Intimate partner violence:    Fear of current or ex partner: Not on file    Emotionally abused: Not on file    Physically abused: Not on file    Forced sexual activity: Not on file  Other Topics Concern  . Not on file  Social History Narrative   Lives at home with husband.   Three children (2 biologic, 1 adopted)   Right-handed.   2 cups caffeine per day.     PHYSICAL EXAM   Vitals:   04/01/18 0808  BP: 123/79  Pulse: 72  Weight: 138 lb 12 oz (62.9 kg)  Height: 5\' 4"  (1.626 m)    Not recorded      Body mass index is 23.82 kg/m.  PHYSICAL EXAMNIATION:  Gen: NAD, conversant, well nourised, obese, well groomed                     Cardiovascular: Regular rate rhythm, no peripheral edema, warm, nontender. Eyes: Conjunctivae clear without exudates or hemorrhage Neck: Supple, no carotid bruits. Pulmonary: Clear to auscultation bilaterally   NEUROLOGICAL EXAM:  MENTAL STATUS: Speech:    Speech is normal; fluent and spontaneous with normal comprehension.  Cognition:     Orientation to time, place and person     Normal recent and remote memory     Normal Attention span and concentration     Normal Language, naming, repeating,spontaneous speech     Fund of knowledge   CRANIAL NERVES: CN II: Visual fields are full to confrontation. Fundoscopic exam is normal with sharp discs and no vascular changes. Pupils are round equal and briskly reactive to light. CN III, IV, VI: extraocular movement are normal. No  ptosis. CN V: Facial sensation is intact to pinprick in all 3 divisions bilaterally. Corneal responses are intact.  CN VII: Face is symmetric with normal eye closure and smile. CN VIII: Hearing is normal to rubbing fingers CN IX, X: Palate elevates symmetrically. Phonation is normal. CN XI: Head turning and shoulder shrug are intact CN XII: Tongue is midline with normal movements and no atrophy.  MOTOR: There is  no pronator drift of out-stretched arms. Muscle bulk and tone are normal. Muscle strength is normal.  REFLEXES: Reflexes are 2+ and symmetric at the biceps, triceps, knees, and ankles. Plantar responses are flexor.  SENSORY: Intact to light touch, pinprick, positional sensation and vibratory sensation are intact in fingers and toes.  COORDINATION: Rapid alternating movements and fine finger movements are intact. There is no dysmetria on finger-to-nose and heel-knee-shin.    GAIT/STANCE: Posture is normal. Gait is steady with normal steps, base, arm swing, and turning. Heel and toe walking are normal. Tandem gait is normal.  Romberg is absent.   DIAGNOSTIC DATA (LABS, IMAGING, TESTING) - I reviewed patient records, labs, notes, testing and imaging myself where available.   ASSESSMENT AND PLAN  Kara Sweeney is a 56 y.o. female   Visual aura  Mostly consistent with  migraine aura without headaches  After discussed with patient, will hold off imaging study at this point,   Possible obstructive sleep apnea  Referring her to sleep study   Marcial Pacas, M.D. Ph.D.  [facility] [address] [place], [place] [address] Ph: [phone] Fax: [phone]  CC: Jacelyn Pi, MD Janie Morning, DO

## 2018-04-04 HISTORY — PX: OTHER SURGICAL HISTORY: SHX169

## 2018-05-15 DIAGNOSIS — M25531 Pain in right wrist: Secondary | ICD-10-CM | POA: Diagnosis not present

## 2018-05-16 DIAGNOSIS — E039 Hypothyroidism, unspecified: Secondary | ICD-10-CM | POA: Diagnosis not present

## 2018-05-22 ENCOUNTER — Encounter: Payer: Self-pay | Admitting: Neurology

## 2018-05-22 ENCOUNTER — Ambulatory Visit (INDEPENDENT_AMBULATORY_CARE_PROVIDER_SITE_OTHER): Payer: BLUE CROSS/BLUE SHIELD | Admitting: Neurology

## 2018-05-22 VITALS — BP 108/73 | HR 79 | Wt 137.0 lb

## 2018-05-22 DIAGNOSIS — G4709 Other insomnia: Secondary | ICD-10-CM

## 2018-05-22 DIAGNOSIS — G4730 Sleep apnea, unspecified: Secondary | ICD-10-CM | POA: Diagnosis not present

## 2018-05-22 DIAGNOSIS — R0683 Snoring: Secondary | ICD-10-CM | POA: Diagnosis not present

## 2018-05-22 NOTE — Progress Notes (Signed)
SLEEP MEDICINE CLINIC   Provider:  Larey Seat, M D  Primary Care Physician:  Janie Morning, DO   Referring Provider:  Dr. Marcial Pacas, MD, PhD   Chief Complaint  Patient presents with  . New Patient (Initial Visit)    pt alone, rm 11. pt presents today from seeing Dr Krista Blue and she has never had a sleep study. pt has been told by her husband has hears her gasping for air and she wakes up frequently during the night,.averages about 6 hrs of sleep..she has been told that she snores and moans,hums in sleep. 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 She is seen here in a referral  from Dr. Krista Blue.    Chief complaint according to patient : "I wake up far too early. My husband noted me to snore, humming, pausing in my breath. "   Sleep habits are as follows: Dinner time is 6 Pm, early bedtime at 9 Pm, sleeps by 10 PM. The marital bedroom is described as cool, quiet and dark.  The children are grown and no longer living in the parental home, and there is no pet. She likes to  sleep laterally, on 2 pillows. The TV in on for a period of 45 minutes- and is then off.   The patient is not quite sure what wakes her at 2 or 3 AM, there is no urge to urinate, no discomfort pain, diaphoresis or nausea- there are headaches! Headaches have arisen over the last couple of years just as the insomnia, and while she sometimes finds them present after she wakes up there have been incidents where she is woken by the headache itself.  Her problem early morning arousals between 2 and 4 AM at a time when she does not want to wake up and does not feel that she had enough sleep.  She often will change from the bedroom to another location of the home usually the cooler downstairs then, and some mornings she will be going back to sleep for another  couple of hours.  She averages a sleep time of about 5 to 6 hours.  She may sometimes wake up with a dry mouth, she does not have significant nasal congestion.  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 She avoids daytime naps. She gets drowsy at 3-4 PM.    Sleep relevant medical history  : DDD cervical region, numbness in right dominant hand when sleeping with her arm "tucked "under.  Right hand dominant.  Insomnia- she can fall asleep but not stay asleep.   Wakes up at 4 AM, at a time undesired. Alden Hipp about 2-3 years ago.   Marland Kitchen Possible TIA    Reports two episodes of visual disturbance (like looking through a kaleidoscope).  With the second event, she also noticed difficulty pronouncing her words.  Symptoms lasted around five minutes before resolving.   . Insomnia    She has difficulty sleeping.  She has tried Ambien in the past.  She felt oversedated with the medication.   She will occasionally take Melantonin 3mg .  . PCP    Janie Morning, DO  . Endocrinology    Jacelyn Pi, MD - referring provider for Hypothyrosis.     Family history: her mother snored, was  never evaluated for sleep apnea/ no history of insomnia. One sister died young at age 56 of liver cancer , no known sleep disorder.   HISTORICAL: Kara Sweeney is a 57 year old female seen in request by his endocrinologist Dr. Chalmers Cater, Mindi Curling, and the primary care physician Dr. Janie Morning, for evaluation of possible TIA, chronic insomnia, initial evaluation was April 01, 2018.  Review of Systems: Out of a complete 14 system review, the patient complains of only the following symptoms, and all other reviewed systems are negative. Snoring,  Early morning arousals. Afternoon hypersomnia. Breast fibrosis,  Felt she slept better after ibuprofen ! inflammatory process / pain.    Epworth Sleepiness score: 11/ 24  Fatigue severity score: 23/ 63 points.   Depression score ; n/a    Social History     Socioeconomic History  . Marital status: Married    Spouse name: Not on file  . Number of children: 3  . Years of education: college  . Highest education level: Master's degree (e.g., MA, MS, MEng, MEd, MSW, MBA)  Occupational History  . Occupation: Chief Technology Officer  Social Needs  . Financial resource strain: Not on file  . Food insecurity:    Worry: Not on file    Inability: Not on file  . Transportation needs:    Medical: Not on file    Non-medical: Not on file  Tobacco Use  . Smoking status: Never Smoker  . Smokeless tobacco: Never Used  Substance and Sexual Activity  . Alcohol use: Yes    Comment: occasional  . Drug use: No  . Sexual activity: Not on file  Lifestyle  . Physical activity:    Days per week: Not on file    Minutes per session: Not on file  . Stress: Not on file  Relationships  . Social connections:    Talks on phone: Not on file    Gets together: Not on file    Attends religious service: Not on file    Active member of club or organization: Not on file    Attends meetings of clubs or organizations: Not on file    Relationship status: Not on file  . Intimate partner violence:    Fear of current or ex partner: Not on file    Emotionally abused: Not on file    Physically abused: Not on file    Forced sexual activity: Not on file  Other Topics Concern  . Not on file  Social History Narrative   Lives at home with husband.   Three children (2 biologic, 1 adopted)   Right-handed.   2 cups caffeine per day.    Family History  Problem Relation Age of Onset  . Cancer Father        bladder  . Cancer Sister        liver; died at 104 yrs old  . Early death Sister 24  . Dementia Mother     Past Medical History:  Diagnosis Date  . Cyst    on right hip and bottom of right foot   . Fibroadenoma    right breast   . Gallstone 2012  . Headache   . Herniated disc, cervical   . Nipple discharge   . Sleeping difficulty   . Thyroid disease     hypothyroidism   . Wears glasses    contacts    Past Surgical History:  Procedure Laterality Date  . ABDOMINAL HYSTERECTOMY    . APPENDECTOMY    .  BREAST EXCISIONAL BIOPSY Right 2012  . CHOLECYSTECTOMY    . finger cyst removal  04/2018   right middle finger.    Current Outpatient Medications  Medication Sig Dispense Refill  . estradiol (VIVELLE-DOT) 0.1 MG/24HR Place 1 patch onto the skin 2 (two) times a week.      . levothyroxine (SYNTHROID, LEVOTHROID) 175 MCG tablet Take 175 mcg by mouth daily before breakfast.    . MELATONIN PO Take 3 mg by mouth at bedtime as needed.     No current facility-administered medications for this visit.     Allergies as of 05/22/2018 - Review Complete 05/22/2018  Allergen Reaction Noted  . Sulfa drugs cross reactors  01/23/2011    Vitals: BP 108/73   Pulse 79   Wt 137 lb (62.1 kg)   BMI 23.52 kg/m  Last Weight:  Wt Readings from Last 1 Encounters:  05/22/18 137 lb (62.1 kg)   TIR:WERX mass index is 23.52 kg/m.      Last Height:   Ht Readings from Last 1 Encounters:  04/01/18 5\' 4"  (1.626 m)    Physical exam:  General: The patient is awake, alert and appears not in acute distress. The patient is well groomed. Head: Normocephalic, atraumatic. Neck is supple. Mallampati 1 ,  neck circumference: 13 "   Nasal airflow patent -, TMJ click not  evident . Retrognathia is not seen.  Cardiovascular:  Regular rate and rhythm , without  murmurs or carotid bruit, and without distended neck veins. Respiratory: Lungs are clear to auscultation. Skin:  Without evidence of edema, or rash Trunk: BMI is low . The patient's posture is erect.   Neurologic exam : The patient is awake and alert, oriented to place and time.   Memory subjective described as intact. Memory testing revealed.  Attention span & concentration ability appears normal.  Speech is fluent,  without dysarthria, dysphonia or aphasia.  Mood and affect are appropriate.  Cranial  nerves: Pupils are equal and briskly reactive to light. Evidence of pallor or edema. Extraocular movements  in vertical and horizontal planes intact and without nystagmus. Visual fields by finger perimetry are intact. Hearing to finger rub intact.  Facial sensation intact to fine touch.  Facial motor strength is symmetric and tongue and uvula move midline. Shoulder shrug was symmetrical.   Motor exam:  Normal tone, muscle bulk and symmetric strength in all extremities. There s weaker grip and pinch strength in the right hand.   Sensory:  Fine touch, pinprick and vibration were normal- Proprioception tested in the upper extremities was normal. Right thumb and indexfinger were numb.   Coordination:  Finger-to-nose maneuver  normal without evidence of ataxia, dysmetria or tremor.  Gait and station: Patient walks without assistive device .  Deep tendon reflexes: in the  upper and lower extremities are symmetric and intact.   Assessment:  After physical and neurologic examination, review of laboratory studies,  Personal review of imaging studies, reports of other /same  Imaging studies, results of polysomnography and / or neurophysiology testing and pre-existing records as far as provided in visit., my assessment is:   1) my first advised to Mrs. Lehrmann is to eliminate electronic devices and screen night from her bedroom.  I will give her the [place] for insomnia.  Essentially take a hot shower retreats to a dark cool and quiet bedroom, all electronics about 30 minutes before bedtime.  Turn the clocks around.  If you wake up read in bed but  in a book was pages not on a device, do not use and LED light which is equally blue but an old incandescent light bulb.  Also these are phasing out that can still be found at the dollar store.  2) the patient has been observed to snore and have apnea and for this reason she should undergo a screening test I think it would be fine for her to undergo a home  sleep test instead of an admitted overnight in lab study.  She does not have a history of heart disease, lung disease, seizures or other cognitive spells or impairment that would justify an in lab study.  3) the patient was referred to me by Dr. Annamaria Boots and I would like for her to have a nerve conduction study EMG to see if her C5-6 area right  Arm and hand dermatome resulted from a bulging disc, foraminal stenosis or spinal stenosis.  The patient was advised of the nature of the diagnosed disorder , the treatment options and the  risks for general health and wellness arising from not treating the condition.   I spent more than 45  minutes of face to face time with the patient.  Greater than 50% of time was spent in counseling and coordination of care. We have discussed the diagnosis and differential and I answered the patient's questions.    Plan:  Treatment plan and additional workup :  HST. Sleep hygiene and Dr Krista Blue to look into C5-6 DDD.    No orders of the defined types were placed in this encounter.    No orders of the defined types were placed in this encounter.    No follow-ups on file.   Larey Seat, MD 67/67/2094, 7:09 PM  Certified in Neurology by ABPN Certified in [place] by [facility] [address], [place], [place] [address]

## 2018-06-25 ENCOUNTER — Ambulatory Visit (INDEPENDENT_AMBULATORY_CARE_PROVIDER_SITE_OTHER): Payer: BLUE CROSS/BLUE SHIELD | Admitting: Neurology

## 2018-06-25 DIAGNOSIS — G4733 Obstructive sleep apnea (adult) (pediatric): Secondary | ICD-10-CM | POA: Diagnosis not present

## 2018-06-25 DIAGNOSIS — G4730 Sleep apnea, unspecified: Secondary | ICD-10-CM

## 2018-06-25 DIAGNOSIS — G4709 Other insomnia: Secondary | ICD-10-CM

## 2018-06-25 DIAGNOSIS — R0683 Snoring: Secondary | ICD-10-CM

## 2018-07-07 ENCOUNTER — Other Ambulatory Visit: Payer: Self-pay | Admitting: Obstetrics and Gynecology

## 2018-07-07 DIAGNOSIS — G4709 Other insomnia: Secondary | ICD-10-CM | POA: Insufficient documentation

## 2018-07-07 DIAGNOSIS — Z1231 Encounter for screening mammogram for malignant neoplasm of breast: Secondary | ICD-10-CM

## 2018-07-07 DIAGNOSIS — R0683 Snoring: Secondary | ICD-10-CM | POA: Insufficient documentation

## 2018-07-07 NOTE — Procedures (Signed)
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 She was seen on 05-22-2018. Chief complaint according to patient: "I wake up far too early" and " My husband noted me to snore, humming, pausing in my breath. "  [name] is asleep by 10PM. The patient is not quite sure what wakes her at 2 or 3 AM, there is no urge to urinate, no discomfort pain, diaphoresis or nausea- there are headaches! Headaches have arisen over the last couple of years just as the insomnia, and while she sometimes finds them present after she wakes up there have been incidents where she is woken by the headache itself.   Her problem is that of early morning arousals between 2 and 4 AM when she does not want to wake up and does not feel that she had enough sleep.  She often will change from the bedroom to another location of the home, usually the cooler downstairs den, and some mornings she will be going back to sleep for another couple of hours.  She averages a sleep time of about 5 to 6 hours.  She may sometimes wake up with a dry mouth, she does not have significant nasal congestion.  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 She avoids daytime naps. She gets drowsy in daytime between 3-4 PM. Epworth Sleepiness score: 11/ 24 points, Fatigue severity score: 23/ 63 points, BMI: 23.52 kg/m2  STUDY RESULTS:  Total Recording Time: 7 h 33 mins; Estimated Sleep Time: 6 h 49 mins. Total  Apnea/Hypopnea Index (AHI): 3.4 /h; RDI: 15.3 /h; REM AHI: 6.8 /h. Average Oxygen Saturation:  94%; Lowest Oxygen Saturation: 90 %.  Total Time Oxygen in Saturation below 89 %: 0.0 minutes.  Average Heart Rate: 68 bpm (between 50 and 89 bpm). IMPRESSION: clinically insignificant degree of Sleep Apnea, but moderate snoring with REM accentuation.  RECOMMENDATION: Snoring treatment by mandibular advancement therapy, ENT consultation to evaluate nasal patency, upper airway resistance.  I certify that I have reviewed the raw data recording prior to the issuance of this report in accordance with the standards of the American Academy of Sleep Medicine (AASM). Larey Seat, M.D.  07-06-2018    Medical Director of [facility], accredited by the AASM. Diplomat of the ABPN and ABSM.

## 2018-07-08 ENCOUNTER — Telehealth: Payer: Self-pay | Admitting: Neurology

## 2018-07-08 NOTE — Telephone Encounter (Signed)
-----   Message from Larey Seat, MD sent at 07/07/2018  9:01 AM EST ----- IMPRESSION: clinically insignificant degree of Sleep Apnea, but  moderate snoring with REM accentuation.  RECOMMENDATION: Snoring treatment by mandibular advancement  therapy, ENT consultation to evaluate nasal patency, upper airway  resistance.

## 2018-07-08 NOTE — Telephone Encounter (Signed)
Called patient to discuss sleep study results. No answer at this time. LVM for the patient to call back.   

## 2018-07-09 ENCOUNTER — Encounter: Payer: Self-pay | Admitting: Neurology

## 2018-07-09 NOTE — Telephone Encounter (Signed)
Pt returned call. Please call back when available. 

## 2018-07-09 NOTE — Telephone Encounter (Signed)
Called the patient and reviewed the sleep study results. Advised her of the normal finding. Informed her that the study didn't show any apnea that was any significance. Advised her snoring was present but mostly in the REM sleep. Informed the patient that we could place a dental or ENT referral to assess for airway patency. Pt verbalized understanding and was appreciative for the call

## 2018-07-10 ENCOUNTER — Ambulatory Visit
Admission: RE | Admit: 2018-07-10 | Discharge: 2018-07-10 | Disposition: A | Discharge status: Home / Self Care | Payer: BLUE CROSS/BLUE SHIELD | Source: Ambulatory Visit | Attending: Obstetrics and Gynecology | Admitting: Obstetrics and Gynecology

## 2018-07-10 DIAGNOSIS — Z1231 Encounter for screening mammogram for malignant neoplasm of breast: Secondary | ICD-10-CM

## 2018-07-16 DIAGNOSIS — M25531 Pain in right wrist: Secondary | ICD-10-CM | POA: Diagnosis not present

## 2018-07-16 DIAGNOSIS — M65831 Other synovitis and tenosynovitis, right forearm: Secondary | ICD-10-CM | POA: Diagnosis not present

## 2018-08-14 DIAGNOSIS — M65831 Other synovitis and tenosynovitis, right forearm: Secondary | ICD-10-CM | POA: Diagnosis not present

## 2018-08-14 DIAGNOSIS — M25531 Pain in right wrist: Secondary | ICD-10-CM | POA: Diagnosis not present

## 2018-11-25 DIAGNOSIS — H11159 Pinguecula, unspecified eye: Secondary | ICD-10-CM | POA: Diagnosis not present

## 2019-02-03 DIAGNOSIS — Z1211 Encounter for screening for malignant neoplasm of colon: Secondary | ICD-10-CM | POA: Diagnosis not present

## 2019-02-03 DIAGNOSIS — K5730 Diverticulosis of large intestine without perforation or abscess without bleeding: Secondary | ICD-10-CM | POA: Diagnosis not present

## 2019-02-03 DIAGNOSIS — Z86010 Personal history of colonic polyps: Secondary | ICD-10-CM | POA: Diagnosis not present

## 2019-02-11 DIAGNOSIS — K5730 Diverticulosis of large intestine without perforation or abscess without bleeding: Secondary | ICD-10-CM | POA: Diagnosis not present

## 2019-02-11 DIAGNOSIS — Z1211 Encounter for screening for malignant neoplasm of colon: Secondary | ICD-10-CM | POA: Diagnosis not present

## 2019-02-11 DIAGNOSIS — D128 Benign neoplasm of rectum: Secondary | ICD-10-CM | POA: Diagnosis not present

## 2019-02-11 DIAGNOSIS — K621 Rectal polyp: Secondary | ICD-10-CM | POA: Diagnosis not present

## 2019-03-20 DIAGNOSIS — E7800 Pure hypercholesterolemia, unspecified: Secondary | ICD-10-CM | POA: Diagnosis not present

## 2019-03-20 DIAGNOSIS — R7301 Impaired fasting glucose: Secondary | ICD-10-CM | POA: Diagnosis not present

## 2019-03-20 DIAGNOSIS — E039 Hypothyroidism, unspecified: Secondary | ICD-10-CM | POA: Diagnosis not present

## 2019-03-26 DIAGNOSIS — Z23 Encounter for immunization: Secondary | ICD-10-CM | POA: Diagnosis not present

## 2019-03-26 DIAGNOSIS — R749 Abnormal serum enzyme level, unspecified: Secondary | ICD-10-CM | POA: Diagnosis not present

## 2019-03-26 DIAGNOSIS — R7301 Impaired fasting glucose: Secondary | ICD-10-CM | POA: Diagnosis not present

## 2019-03-26 DIAGNOSIS — E7800 Pure hypercholesterolemia, unspecified: Secondary | ICD-10-CM | POA: Diagnosis not present

## 2019-03-26 DIAGNOSIS — E039 Hypothyroidism, unspecified: Secondary | ICD-10-CM | POA: Diagnosis not present

## 2019-04-02 DIAGNOSIS — H02729 Madarosis of unspecified eye, unspecified eyelid and periocular area: Secondary | ICD-10-CM | POA: Diagnosis not present

## 2019-04-02 DIAGNOSIS — D1801 Hemangioma of skin and subcutaneous tissue: Secondary | ICD-10-CM | POA: Diagnosis not present

## 2019-04-02 DIAGNOSIS — L814 Other melanin hyperpigmentation: Secondary | ICD-10-CM | POA: Diagnosis not present

## 2019-04-02 DIAGNOSIS — D225 Melanocytic nevi of trunk: Secondary | ICD-10-CM | POA: Diagnosis not present

## 2019-09-17 DIAGNOSIS — Z13820 Encounter for screening for osteoporosis: Secondary | ICD-10-CM | POA: Diagnosis not present

## 2019-09-17 DIAGNOSIS — N760 Acute vaginitis: Secondary | ICD-10-CM | POA: Diagnosis not present

## 2019-09-17 DIAGNOSIS — Z01419 Encounter for gynecological examination (general) (routine) without abnormal findings: Secondary | ICD-10-CM | POA: Diagnosis not present

## 2019-09-17 DIAGNOSIS — Z6822 Body mass index (BMI) 22.0-22.9, adult: Secondary | ICD-10-CM | POA: Diagnosis not present

## 2019-09-22 ENCOUNTER — Other Ambulatory Visit: Payer: Self-pay | Admitting: Obstetrics and Gynecology

## 2019-09-22 DIAGNOSIS — Z1231 Encounter for screening mammogram for malignant neoplasm of breast: Secondary | ICD-10-CM

## 2019-10-15 ENCOUNTER — Ambulatory Visit
Admission: RE | Admit: 2019-10-15 | Discharge: 2019-10-15 | Disposition: A | Discharge status: Home / Self Care | Payer: BC Managed Care – PPO | Source: Ambulatory Visit | Attending: Obstetrics and Gynecology | Admitting: Obstetrics and Gynecology

## 2019-10-15 ENCOUNTER — Other Ambulatory Visit: Payer: Self-pay

## 2019-10-15 DIAGNOSIS — Z1231 Encounter for screening mammogram for malignant neoplasm of breast: Secondary | ICD-10-CM

## 2019-10-15 DIAGNOSIS — H1033 Unspecified acute conjunctivitis, bilateral: Secondary | ICD-10-CM | POA: Diagnosis not present

## 2019-10-16 ENCOUNTER — Other Ambulatory Visit: Payer: Self-pay | Admitting: Obstetrics and Gynecology

## 2019-10-16 DIAGNOSIS — R928 Other abnormal and inconclusive findings on diagnostic imaging of breast: Secondary | ICD-10-CM

## 2019-10-26 ENCOUNTER — Other Ambulatory Visit: Payer: Self-pay

## 2019-10-26 ENCOUNTER — Other Ambulatory Visit: Payer: Self-pay | Admitting: Obstetrics and Gynecology

## 2019-10-26 ENCOUNTER — Ambulatory Visit
Admission: RE | Admit: 2019-10-26 | Discharge: 2019-10-26 | Disposition: A | Discharge status: Home / Self Care | Payer: BC Managed Care – PPO | Source: Ambulatory Visit | Attending: Obstetrics and Gynecology | Admitting: Obstetrics and Gynecology

## 2019-10-26 DIAGNOSIS — R928 Other abnormal and inconclusive findings on diagnostic imaging of breast: Secondary | ICD-10-CM

## 2019-10-26 DIAGNOSIS — N6002 Solitary cyst of left breast: Secondary | ICD-10-CM | POA: Diagnosis not present

## 2019-10-26 DIAGNOSIS — R922 Inconclusive mammogram: Secondary | ICD-10-CM | POA: Diagnosis not present

## 2019-10-26 DIAGNOSIS — N6320 Unspecified lump in the left breast, unspecified quadrant: Secondary | ICD-10-CM

## 2019-12-17 DIAGNOSIS — H11159 Pinguecula, unspecified eye: Secondary | ICD-10-CM | POA: Diagnosis not present

## 2020-03-23 DIAGNOSIS — E039 Hypothyroidism, unspecified: Secondary | ICD-10-CM | POA: Diagnosis not present

## 2020-03-23 DIAGNOSIS — E7800 Pure hypercholesterolemia, unspecified: Secondary | ICD-10-CM | POA: Diagnosis not present

## 2020-03-23 DIAGNOSIS — R7301 Impaired fasting glucose: Secondary | ICD-10-CM | POA: Diagnosis not present

## 2020-03-30 DIAGNOSIS — E7800 Pure hypercholesterolemia, unspecified: Secondary | ICD-10-CM | POA: Diagnosis not present

## 2020-03-30 DIAGNOSIS — R7301 Impaired fasting glucose: Secondary | ICD-10-CM | POA: Diagnosis not present

## 2020-03-30 DIAGNOSIS — R749 Abnormal serum enzyme level, unspecified: Secondary | ICD-10-CM | POA: Diagnosis not present

## 2020-03-30 DIAGNOSIS — E039 Hypothyroidism, unspecified: Secondary | ICD-10-CM | POA: Diagnosis not present

## 2020-04-01 DIAGNOSIS — Z20822 Contact with and (suspected) exposure to covid-19: Secondary | ICD-10-CM | POA: Diagnosis not present

## 2020-04-01 DIAGNOSIS — Z1152 Encounter for screening for COVID-19: Secondary | ICD-10-CM | POA: Diagnosis not present

## 2020-04-27 ENCOUNTER — Other Ambulatory Visit: Payer: Self-pay

## 2020-04-27 ENCOUNTER — Ambulatory Visit
Admission: RE | Admit: 2020-04-27 | Discharge: 2020-04-27 | Disposition: A | Discharge status: Home / Self Care | Payer: BC Managed Care – PPO | Source: Ambulatory Visit | Attending: Obstetrics and Gynecology | Admitting: Obstetrics and Gynecology

## 2020-04-27 DIAGNOSIS — N6322 Unspecified lump in the left breast, upper inner quadrant: Secondary | ICD-10-CM | POA: Diagnosis not present

## 2020-04-27 DIAGNOSIS — N6320 Unspecified lump in the left breast, unspecified quadrant: Secondary | ICD-10-CM

## 2020-05-06 DIAGNOSIS — Z20822 Contact with and (suspected) exposure to covid-19: Secondary | ICD-10-CM | POA: Diagnosis not present

## 2020-05-06 DIAGNOSIS — R5383 Other fatigue: Secondary | ICD-10-CM | POA: Diagnosis not present

## 2020-05-06 DIAGNOSIS — K529 Noninfective gastroenteritis and colitis, unspecified: Secondary | ICD-10-CM | POA: Diagnosis not present

## 2020-05-18 ENCOUNTER — Other Ambulatory Visit: Payer: Self-pay | Admitting: Obstetrics and Gynecology

## 2020-05-18 DIAGNOSIS — N6002 Solitary cyst of left breast: Secondary | ICD-10-CM

## 2020-10-20 DIAGNOSIS — Z01419 Encounter for gynecological examination (general) (routine) without abnormal findings: Secondary | ICD-10-CM | POA: Diagnosis not present

## 2020-10-20 DIAGNOSIS — Z6822 Body mass index (BMI) 22.0-22.9, adult: Secondary | ICD-10-CM | POA: Diagnosis not present

## 2020-10-20 DIAGNOSIS — Z79890 Hormone replacement therapy (postmenopausal): Secondary | ICD-10-CM | POA: Diagnosis not present

## 2020-11-25 ENCOUNTER — Ambulatory Visit
Admission: RE | Admit: 2020-11-25 | Discharge: 2020-11-25 | Disposition: A | Discharge status: Home / Self Care | Payer: BC Managed Care – PPO | Source: Ambulatory Visit | Attending: Obstetrics and Gynecology | Admitting: Obstetrics and Gynecology

## 2020-11-25 ENCOUNTER — Other Ambulatory Visit: Payer: Self-pay

## 2020-11-25 DIAGNOSIS — N6002 Solitary cyst of left breast: Secondary | ICD-10-CM

## 2020-11-25 DIAGNOSIS — R922 Inconclusive mammogram: Secondary | ICD-10-CM | POA: Diagnosis not present

## 2021-01-27 DIAGNOSIS — E039 Hypothyroidism, unspecified: Secondary | ICD-10-CM | POA: Diagnosis not present

## 2021-03-17 DIAGNOSIS — Z135 Encounter for screening for eye and ear disorders: Secondary | ICD-10-CM | POA: Diagnosis not present

## 2021-03-17 DIAGNOSIS — H52223 Regular astigmatism, bilateral: Secondary | ICD-10-CM | POA: Diagnosis not present

## 2021-03-17 DIAGNOSIS — H524 Presbyopia: Secondary | ICD-10-CM | POA: Diagnosis not present

## 2021-03-17 DIAGNOSIS — H5213 Myopia, bilateral: Secondary | ICD-10-CM | POA: Diagnosis not present

## 2021-03-23 DIAGNOSIS — R749 Abnormal serum enzyme level, unspecified: Secondary | ICD-10-CM | POA: Diagnosis not present

## 2021-03-23 DIAGNOSIS — E039 Hypothyroidism, unspecified: Secondary | ICD-10-CM | POA: Diagnosis not present

## 2021-03-23 DIAGNOSIS — R7301 Impaired fasting glucose: Secondary | ICD-10-CM | POA: Diagnosis not present

## 2021-03-23 DIAGNOSIS — E7800 Pure hypercholesterolemia, unspecified: Secondary | ICD-10-CM | POA: Diagnosis not present

## 2021-03-30 DIAGNOSIS — R7301 Impaired fasting glucose: Secondary | ICD-10-CM | POA: Diagnosis not present

## 2021-03-30 DIAGNOSIS — E7800 Pure hypercholesterolemia, unspecified: Secondary | ICD-10-CM | POA: Diagnosis not present

## 2021-03-30 DIAGNOSIS — E039 Hypothyroidism, unspecified: Secondary | ICD-10-CM | POA: Diagnosis not present

## 2021-03-30 DIAGNOSIS — R749 Abnormal serum enzyme level, unspecified: Secondary | ICD-10-CM | POA: Diagnosis not present

## 2021-05-11 DIAGNOSIS — R749 Abnormal serum enzyme level, unspecified: Secondary | ICD-10-CM | POA: Diagnosis not present

## 2021-05-11 DIAGNOSIS — E7800 Pure hypercholesterolemia, unspecified: Secondary | ICD-10-CM | POA: Diagnosis not present

## 2021-05-20 IMAGING — US US BREAST*L* LIMITED INC AXILLA
1 series · 6 of 6 positions shown · non-contrast
Comparison: Previous exam(s).

CLINICAL DATA: 59-year-old female presenting for follow-up of a
probably benign left breast mass.

EXAM:
ULTRASOUND OF THE LEFT BREAST

[Series 1: us breast*left* limited inc axilla · 0.07mm/px · 6 of 6 slices shown]
[im 1/6]
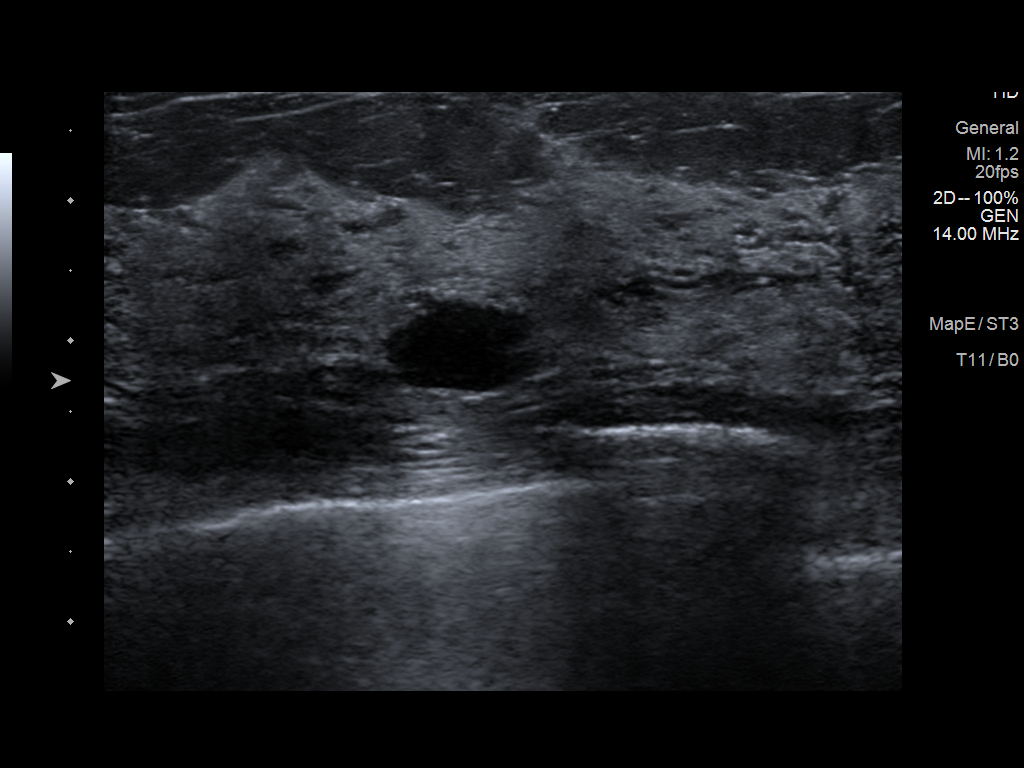
[im 2/6]
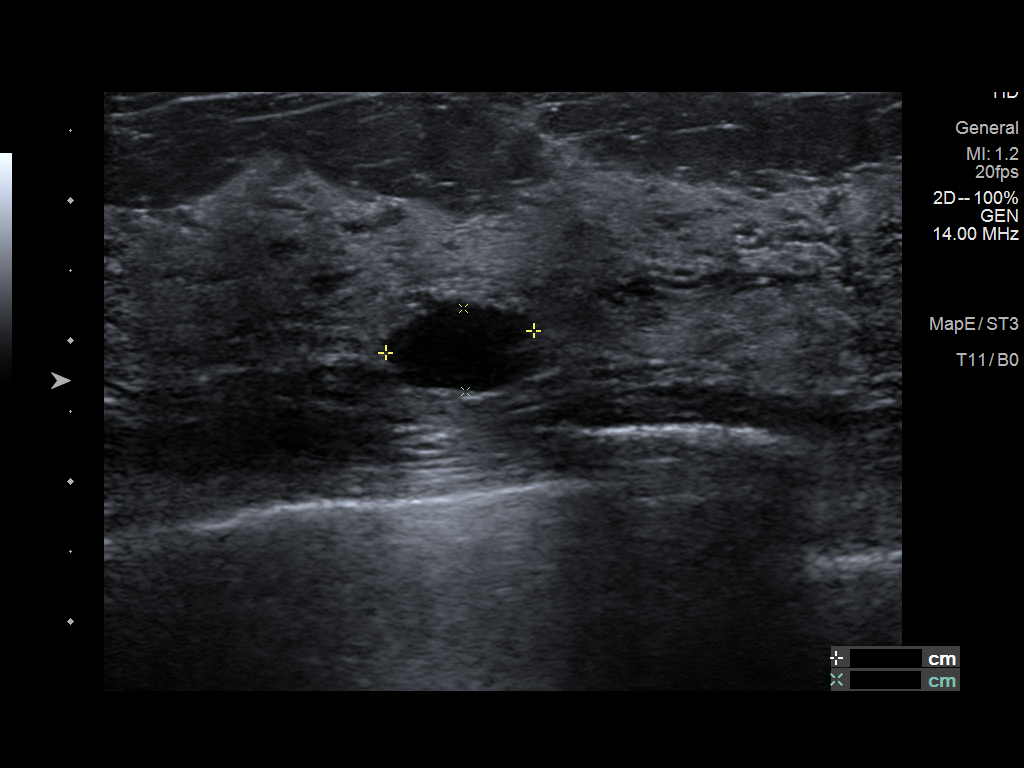
[im 3/6]
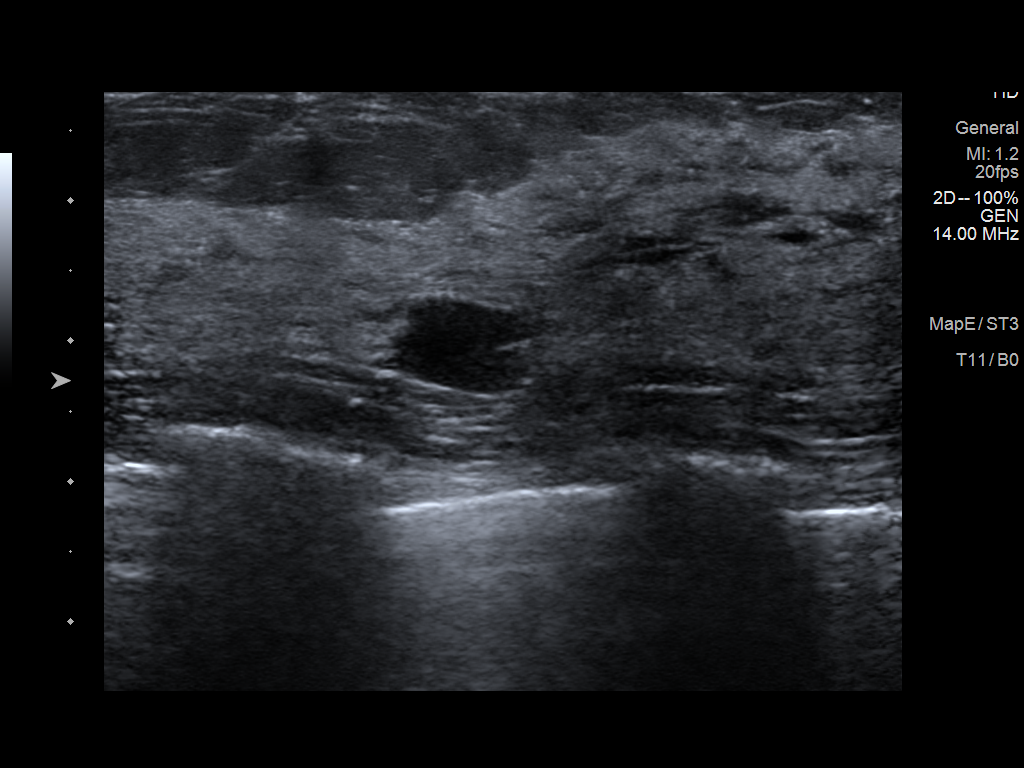
[im 4/6]
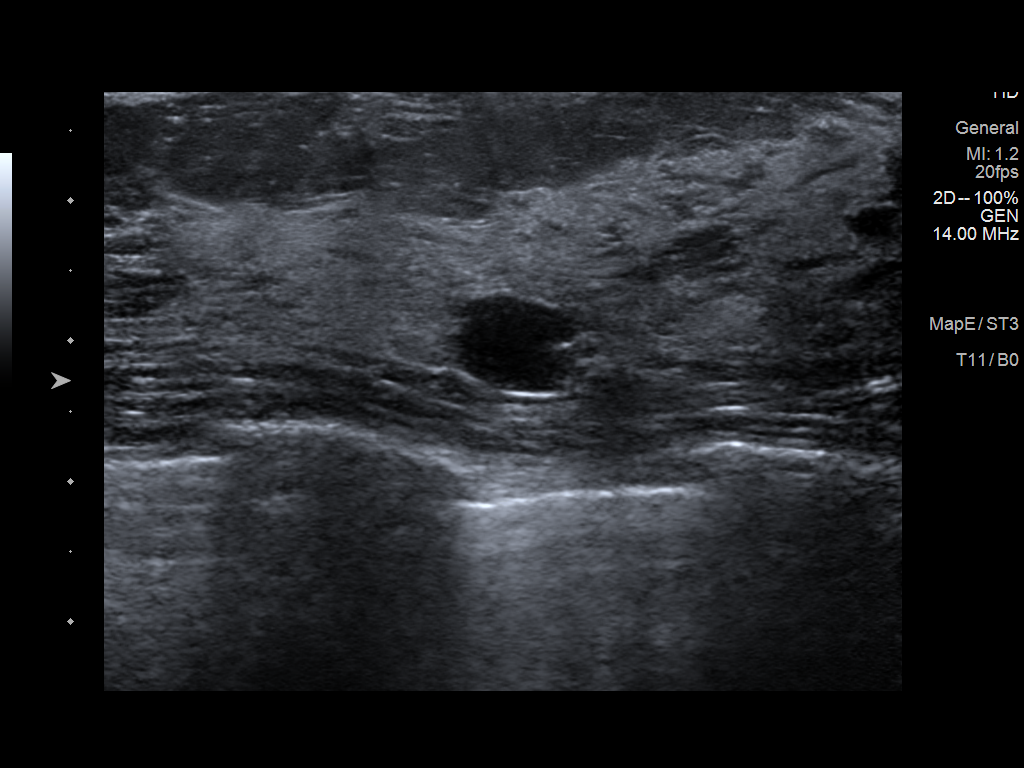
[im 5/6]
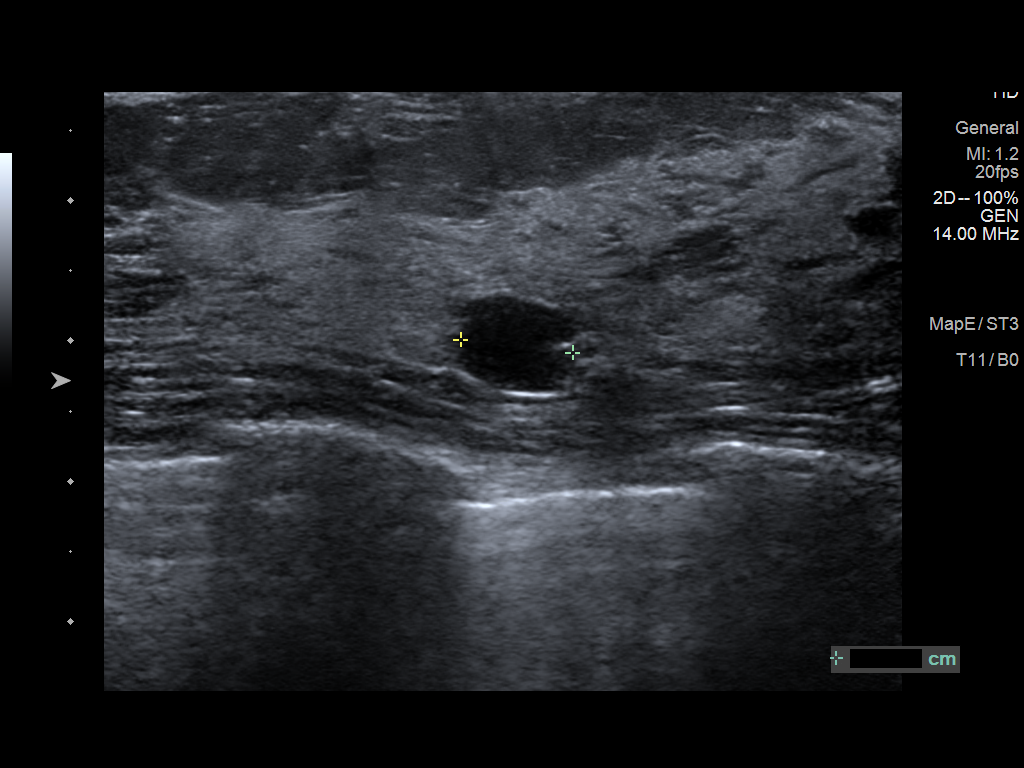
[im 6/6]
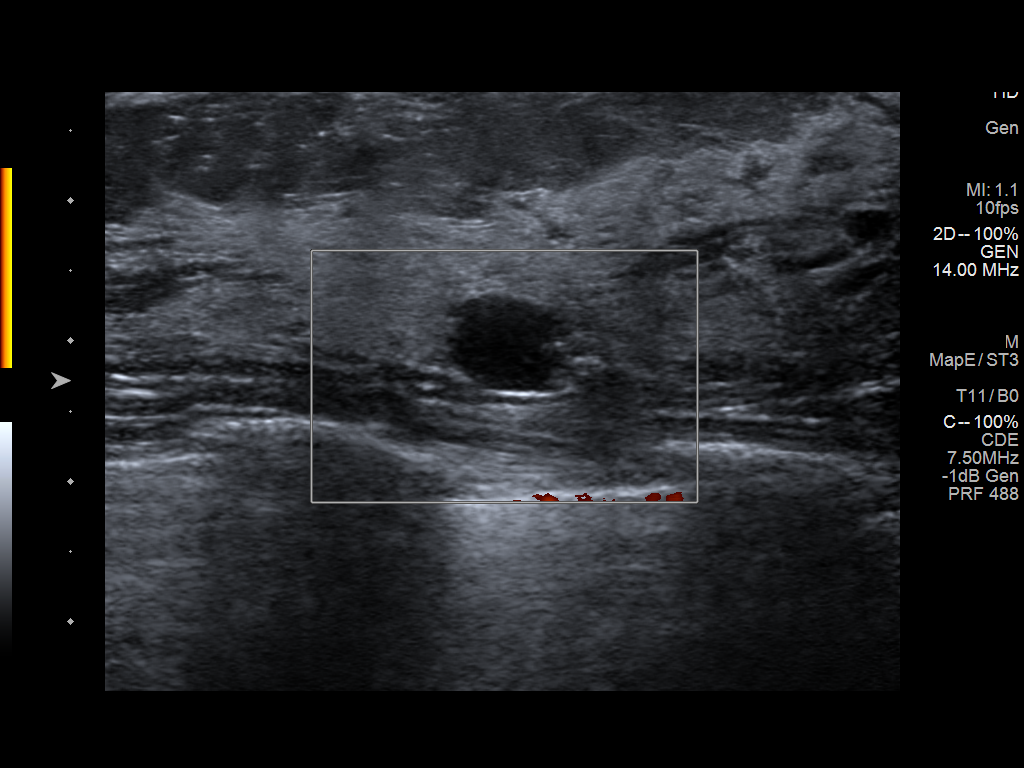

[6 of 6 positions shown; findings below may reference images not displayed]

FINDINGS: Targeted ultrasound is performed in the left breast at 11:30 o'clock
2 cm from the nipple demonstrating an oval circumscribed hypoechoic
mass measuring 1.1 x 0.6 x 0.8 cm, previously measuring 1.0 x 0.6 x
0.8 cm. No internal vascularity.
IMPRESSION: Stable probably benign left breast mass at 11 o'clock measuring
cm.

RECOMMENDATION:
Diagnostic bilateral mammogram and left breast ultrasound in 6
months to confirm 1 year stability of the probably benign left
breast mass and for annual exam of the right breast.

I have discussed the findings and recommendations with the patient.
If applicable, a reminder letter will be sent to the patient
regarding the next appointment.

BI-RADS CATEGORY  3: Probably benign.

## 2021-10-26 DIAGNOSIS — Z01419 Encounter for gynecological examination (general) (routine) without abnormal findings: Secondary | ICD-10-CM | POA: Diagnosis not present

## 2021-10-26 DIAGNOSIS — Z79890 Hormone replacement therapy (postmenopausal): Secondary | ICD-10-CM | POA: Diagnosis not present

## 2021-10-26 DIAGNOSIS — Z6822 Body mass index (BMI) 22.0-22.9, adult: Secondary | ICD-10-CM | POA: Diagnosis not present

## 2021-11-07 DIAGNOSIS — Z13820 Encounter for screening for osteoporosis: Secondary | ICD-10-CM | POA: Diagnosis not present

## 2022-03-30 DIAGNOSIS — E039 Hypothyroidism, unspecified: Secondary | ICD-10-CM | POA: Diagnosis not present

## 2022-03-30 DIAGNOSIS — R749 Abnormal serum enzyme level, unspecified: Secondary | ICD-10-CM | POA: Diagnosis not present

## 2022-03-30 DIAGNOSIS — R7301 Impaired fasting glucose: Secondary | ICD-10-CM | POA: Diagnosis not present

## 2022-03-30 DIAGNOSIS — E7800 Pure hypercholesterolemia, unspecified: Secondary | ICD-10-CM | POA: Diagnosis not present

## 2022-04-04 DIAGNOSIS — R749 Abnormal serum enzyme level, unspecified: Secondary | ICD-10-CM | POA: Diagnosis not present

## 2022-04-04 DIAGNOSIS — R7301 Impaired fasting glucose: Secondary | ICD-10-CM | POA: Diagnosis not present

## 2022-04-04 DIAGNOSIS — E039 Hypothyroidism, unspecified: Secondary | ICD-10-CM | POA: Diagnosis not present

## 2022-04-04 DIAGNOSIS — E7800 Pure hypercholesterolemia, unspecified: Secondary | ICD-10-CM | POA: Diagnosis not present

## 2022-04-04 DIAGNOSIS — Z23 Encounter for immunization: Secondary | ICD-10-CM | POA: Diagnosis not present

## 2022-06-08 DIAGNOSIS — E039 Hypothyroidism, unspecified: Secondary | ICD-10-CM | POA: Diagnosis not present

## 2022-11-01 DIAGNOSIS — Z6823 Body mass index (BMI) 23.0-23.9, adult: Secondary | ICD-10-CM | POA: Diagnosis not present

## 2022-11-01 DIAGNOSIS — Z01419 Encounter for gynecological examination (general) (routine) without abnormal findings: Secondary | ICD-10-CM | POA: Diagnosis not present

## 2023-04-08 DIAGNOSIS — R7301 Impaired fasting glucose: Secondary | ICD-10-CM | POA: Diagnosis not present

## 2023-04-08 DIAGNOSIS — E039 Hypothyroidism, unspecified: Secondary | ICD-10-CM | POA: Diagnosis not present

## 2023-04-08 DIAGNOSIS — E7800 Pure hypercholesterolemia, unspecified: Secondary | ICD-10-CM | POA: Diagnosis not present

## 2023-04-08 DIAGNOSIS — R749 Abnormal serum enzyme level, unspecified: Secondary | ICD-10-CM | POA: Diagnosis not present

## 2023-04-12 DIAGNOSIS — Z23 Encounter for immunization: Secondary | ICD-10-CM | POA: Diagnosis not present

## 2023-04-12 DIAGNOSIS — E7800 Pure hypercholesterolemia, unspecified: Secondary | ICD-10-CM | POA: Diagnosis not present

## 2023-04-12 DIAGNOSIS — R749 Abnormal serum enzyme level, unspecified: Secondary | ICD-10-CM | POA: Diagnosis not present

## 2023-04-12 DIAGNOSIS — R7301 Impaired fasting glucose: Secondary | ICD-10-CM | POA: Diagnosis not present

## 2023-04-12 DIAGNOSIS — E039 Hypothyroidism, unspecified: Secondary | ICD-10-CM | POA: Diagnosis not present

## 2023-04-26 ENCOUNTER — Other Ambulatory Visit: Payer: Self-pay | Admitting: Obstetrics and Gynecology

## 2023-04-26 DIAGNOSIS — N6320 Unspecified lump in the left breast, unspecified quadrant: Secondary | ICD-10-CM

## 2023-05-16 ENCOUNTER — Other Ambulatory Visit: Payer: BC Managed Care – PPO

## 2023-05-21 ENCOUNTER — Ambulatory Visit
Admission: RE | Admit: 2023-05-21 | Discharge: 2023-05-21 | Disposition: A | Discharge status: Home / Self Care | Payer: BC Managed Care – PPO | Source: Ambulatory Visit | Attending: Obstetrics and Gynecology | Admitting: Obstetrics and Gynecology

## 2023-05-21 DIAGNOSIS — N6320 Unspecified lump in the left breast, unspecified quadrant: Secondary | ICD-10-CM

## 2023-05-21 DIAGNOSIS — N6322 Unspecified lump in the left breast, upper inner quadrant: Secondary | ICD-10-CM | POA: Diagnosis not present

## 2023-06-12 DIAGNOSIS — E039 Hypothyroidism, unspecified: Secondary | ICD-10-CM | POA: Diagnosis not present

## 2023-10-11 DIAGNOSIS — E039 Hypothyroidism, unspecified: Secondary | ICD-10-CM | POA: Diagnosis not present

## 2023-10-11 DIAGNOSIS — R7301 Impaired fasting glucose: Secondary | ICD-10-CM | POA: Diagnosis not present

## 2023-10-11 DIAGNOSIS — E7800 Pure hypercholesterolemia, unspecified: Secondary | ICD-10-CM | POA: Diagnosis not present

## 2023-10-18 DIAGNOSIS — E7800 Pure hypercholesterolemia, unspecified: Secondary | ICD-10-CM | POA: Diagnosis not present

## 2023-10-18 DIAGNOSIS — E039 Hypothyroidism, unspecified: Secondary | ICD-10-CM | POA: Diagnosis not present

## 2023-10-18 DIAGNOSIS — R749 Abnormal serum enzyme level, unspecified: Secondary | ICD-10-CM | POA: Diagnosis not present

## 2023-10-18 DIAGNOSIS — R7301 Impaired fasting glucose: Secondary | ICD-10-CM | POA: Diagnosis not present

## 2023-12-27 DIAGNOSIS — E039 Hypothyroidism, unspecified: Secondary | ICD-10-CM | POA: Diagnosis not present

## 2024-01-30 DIAGNOSIS — Z01419 Encounter for gynecological examination (general) (routine) without abnormal findings: Secondary | ICD-10-CM | POA: Diagnosis not present

## 2024-01-30 DIAGNOSIS — Z6823 Body mass index (BMI) 23.0-23.9, adult: Secondary | ICD-10-CM | POA: Diagnosis not present

## 2024-02-18 DIAGNOSIS — Z23 Encounter for immunization: Secondary | ICD-10-CM | POA: Diagnosis not present

## 2024-02-18 DIAGNOSIS — E039 Hypothyroidism, unspecified: Secondary | ICD-10-CM | POA: Diagnosis not present

## 2024-02-18 DIAGNOSIS — R21 Rash and other nonspecific skin eruption: Secondary | ICD-10-CM | POA: Diagnosis not present

## 2024-04-06 ENCOUNTER — Other Ambulatory Visit: Payer: Self-pay | Admitting: Obstetrics and Gynecology

## 2024-04-06 DIAGNOSIS — Z1231 Encounter for screening mammogram for malignant neoplasm of breast: Secondary | ICD-10-CM

## 2024-05-22 ENCOUNTER — Inpatient Hospital Stay
Admission: RE | Admit: 2024-05-22 | Discharge: 2024-05-22 | Attending: Obstetrics and Gynecology | Admitting: Obstetrics and Gynecology

## 2024-05-22 DIAGNOSIS — Z1231 Encounter for screening mammogram for malignant neoplasm of breast: Secondary | ICD-10-CM
